# Patient Record
Sex: Female | Born: 1955 | Race: White | Hispanic: No | Marital: Married | Smoking: Never smoker
Health system: Southern US, Community
[De-identification: ages and names within clinical notes are randomized; demographics above are authoritative.]

## PROBLEM LIST (undated history)

## (undated) DIAGNOSIS — N200 Calculus of kidney: Secondary | ICD-10-CM

## (undated) DIAGNOSIS — C439 Malignant melanoma of skin, unspecified: Secondary | ICD-10-CM

## (undated) DIAGNOSIS — K8020 Calculus of gallbladder without cholecystitis without obstruction: Secondary | ICD-10-CM

## (undated) HISTORY — DX: Calculus of kidney: N20.0

## (undated) HISTORY — DX: Calculus of gallbladder without cholecystitis without obstruction: K80.20

## (undated) HISTORY — PX: NASAL TURBINATE REDUCTION: SHX2072

---

## 1998-04-16 ENCOUNTER — Other Ambulatory Visit: Admission: RE | Admit: 1998-04-16 | Discharge: 1998-04-16 | Payer: Self-pay | Admitting: Gynecology

## 1999-05-07 ENCOUNTER — Other Ambulatory Visit: Admission: RE | Admit: 1999-05-07 | Discharge: 1999-05-07 | Payer: Self-pay | Admitting: Gynecology

## 2001-06-28 ENCOUNTER — Other Ambulatory Visit: Admission: RE | Admit: 2001-06-28 | Discharge: 2001-06-28 | Payer: Self-pay | Admitting: Gynecology

## 2002-06-25 ENCOUNTER — Other Ambulatory Visit: Admission: RE | Admit: 2002-06-25 | Discharge: 2002-06-25 | Payer: Self-pay | Admitting: Gynecology

## 2002-09-24 ENCOUNTER — Encounter: Admission: RE | Admit: 2002-09-24 | Discharge: 2002-09-24 | Payer: Self-pay | Admitting: Occupational Medicine

## 2002-09-24 ENCOUNTER — Encounter: Payer: Self-pay | Admitting: Occupational Medicine

## 2002-10-08 ENCOUNTER — Encounter: Admission: RE | Admit: 2002-10-08 | Discharge: 2002-10-28 | Payer: Self-pay | Admitting: Occupational Medicine

## 2002-12-18 ENCOUNTER — Encounter: Admission: RE | Admit: 2002-12-18 | Discharge: 2002-12-18 | Payer: Self-pay | Admitting: Occupational Medicine

## 2002-12-18 ENCOUNTER — Encounter: Payer: Self-pay | Admitting: Occupational Medicine

## 2003-07-01 ENCOUNTER — Other Ambulatory Visit: Admission: RE | Admit: 2003-07-01 | Discharge: 2003-07-01 | Payer: Self-pay | Admitting: Gynecology

## 2003-11-15 ENCOUNTER — Emergency Department (HOSPITAL_COMMUNITY): Admission: EM | Admit: 2003-11-15 | Discharge: 2003-11-15 | Payer: Self-pay | Admitting: *Deleted

## 2004-07-26 ENCOUNTER — Other Ambulatory Visit: Admission: RE | Admit: 2004-07-26 | Discharge: 2004-07-26 | Payer: Self-pay | Admitting: Gynecology

## 2005-10-18 ENCOUNTER — Other Ambulatory Visit: Admission: RE | Admit: 2005-10-18 | Discharge: 2005-10-18 | Payer: Self-pay | Admitting: Gynecology

## 2005-11-03 ENCOUNTER — Encounter: Admission: RE | Admit: 2005-11-03 | Discharge: 2005-11-03 | Payer: Self-pay | Admitting: Internal Medicine

## 2005-11-28 ENCOUNTER — Ambulatory Visit (HOSPITAL_COMMUNITY): Admission: RE | Admit: 2005-11-28 | Discharge: 2005-11-28 | Payer: Self-pay | Admitting: *Deleted

## 2006-10-26 ENCOUNTER — Other Ambulatory Visit: Admission: RE | Admit: 2006-10-26 | Discharge: 2006-10-26 | Payer: Self-pay | Admitting: Gynecology

## 2007-06-11 ENCOUNTER — Ambulatory Visit (HOSPITAL_COMMUNITY): Admission: RE | Admit: 2007-06-11 | Discharge: 2007-06-11 | Payer: Self-pay | Admitting: Internal Medicine

## 2007-10-04 ENCOUNTER — Emergency Department (HOSPITAL_COMMUNITY): Admission: EM | Admit: 2007-10-04 | Discharge: 2007-10-04 | Payer: Self-pay | Admitting: Family Medicine

## 2007-11-19 ENCOUNTER — Other Ambulatory Visit: Admission: RE | Admit: 2007-11-19 | Discharge: 2007-11-19 | Payer: Self-pay | Admitting: Gynecology

## 2008-02-10 ENCOUNTER — Emergency Department (HOSPITAL_COMMUNITY): Admission: EM | Admit: 2008-02-10 | Discharge: 2008-02-10 | Payer: Self-pay | Admitting: Family Medicine

## 2008-06-04 ENCOUNTER — Ambulatory Visit (HOSPITAL_COMMUNITY): Admission: RE | Admit: 2008-06-04 | Discharge: 2008-06-04 | Payer: Self-pay | Admitting: Internal Medicine

## 2010-11-26 NOTE — Op Note (Signed)
NAMEJUANICE, WARBURTON                   ACCOUNT NO.:  [id number]   MEDICAL RECORD NO.:  [id number]          PATIENT TYPE:  AMB   LOCATION:  ENDO                         FACILITY:  [facility]   PHYSICIAN:  Georgiana Spinner, M.D.    DATE OF BIRTH:  1956-01-04   DATE OF PROCEDURE:  11/28/2005  DATE OF DISCHARGE:                                 OPERATIVE REPORT   PROCEDURE:  Colonoscopy.   INDICATIONS FOR PROCEDURE:  Colon cancer screening.   ANESTHESIA:  Demerol 40, Versed 1 mg.   DESCRIPTION OF PROCEDURE:  With the patient mildly sedated in the left  lateral decubitus position, the Olympus videoscopic colonoscope was inserted  in the rectum and passed under direct vision to the cecum identified by the  ileocecal valve and appendiceal orifice both of which were photographed.  From this point, the colonoscope was slowly withdrawn taking circumferential  views of the entire colonic mucosa stopping only in the rectum which  appeared normal on direct and showed hemorrhoids on retroflexed view. The  endoscope was straightened and withdrawn. The patient's vital signs and  pulse oximeter remained stable. The patient tolerated the procedure well  without apparent complications.   FINDINGS:  Internal hemorrhoids otherwise an unremarkable colonoscopic  examination to the cecum.   PLAN:  Consider repeat examination in 5-10 years.           ______________________________  Georgiana Spinner, M.D.     GMO/MEDQ  D:  11/28/2005  T:  11/28/2005  Job:  [id number]

## 2010-11-26 NOTE — Op Note (Signed)
Sharon Flores, Sharon Flores                   ACCOUNT NO.:  [id number]   MEDICAL RECORD NO.:  [id number]          PATIENT TYPE:  AMB   LOCATION:  ENDO                         FACILITY:  [facility]   PHYSICIAN:  Georgiana Spinner, M.D.    DATE OF BIRTH:  [birth year]   DATE OF PROCEDURE:  11/28/2005  DATE OF DISCHARGE:                                 OPERATIVE REPORT   PROCEDURE:  Upper endoscopy.   INDICATIONS:  GERD.   ANESTHESIA:  Demerol 60, Versed 7 mg.   DESCRIPTION OF PROCEDURE:  With the patient mildly sedated in the left  lateral decubitus position, the Olympus videoscopic endoscope was inserted  into the mouth and passed under direct vision through the esophagus which  appeared normal into the stomach, fundus, body, antrum, and duodenal bulb.  The second portion of duodenum appeared normal.  From this point the  endoscope was slowly withdrawn taking circumferential views of the duodenal  mucosa until the endoscope then pulled back into the stomach, placed in  retroflexion to view the stomach from below.  The endoscope was straightened  and withdrawn, taking circumferential views of the remaining gastric and  esophageal mucosa.  The patient's vital signs and pulse oximeter remained  stable.  The patient tolerated the procedure well without apparent  complications.   FINDINGS:  Unremarkable examination.   PLAN:  Proceed to colonoscopy.           ______________________________  Georgiana Spinner, M.D.     GMO/MEDQ  D:  11/28/2005  T:  11/28/2005  Job:  [id number]

## 2011-04-08 LAB — POCT RAPID STREP A: Streptococcus, Group A Screen (Direct): NEGATIVE

## 2012-02-14 ENCOUNTER — Other Ambulatory Visit: Payer: Self-pay | Admitting: Internal Medicine

## 2012-02-14 ENCOUNTER — Ambulatory Visit
Admission: RE | Admit: 2012-02-14 | Discharge: 2012-02-14 | Disposition: A | Discharge status: Home / Self Care | Payer: BC Managed Care – PPO | Source: Ambulatory Visit | Attending: Internal Medicine | Admitting: Internal Medicine

## 2012-02-14 DIAGNOSIS — R413 Other amnesia: Secondary | ICD-10-CM

## 2012-10-01 ENCOUNTER — Other Ambulatory Visit: Payer: Self-pay | Admitting: Internal Medicine

## 2012-10-01 DIAGNOSIS — M67912 Unspecified disorder of synovium and tendon, left shoulder: Secondary | ICD-10-CM

## 2012-10-06 ENCOUNTER — Other Ambulatory Visit: Payer: BC Managed Care – PPO

## 2012-10-11 ENCOUNTER — Ambulatory Visit
Admission: RE | Admit: 2012-10-11 | Discharge: 2012-10-11 | Disposition: A | Discharge status: Home / Self Care | Payer: BC Managed Care – PPO | Source: Ambulatory Visit | Attending: Internal Medicine | Admitting: Internal Medicine

## 2012-10-11 DIAGNOSIS — M67912 Unspecified disorder of synovium and tendon, left shoulder: Secondary | ICD-10-CM

## 2015-07-17 ENCOUNTER — Emergency Department (HOSPITAL_COMMUNITY): Payer: Managed Care, Other (non HMO)

## 2015-07-17 ENCOUNTER — Encounter (HOSPITAL_COMMUNITY): Payer: Self-pay

## 2015-07-17 ENCOUNTER — Emergency Department (HOSPITAL_COMMUNITY)
Admission: EM | Admit: 2015-07-17 | Discharge: 2015-07-17 | Disposition: A | Discharge status: Home / Self Care | Payer: Managed Care, Other (non HMO) | Attending: Emergency Medicine | Admitting: Emergency Medicine

## 2015-07-17 DIAGNOSIS — Z91040 Latex allergy status: Secondary | ICD-10-CM | POA: Insufficient documentation

## 2015-07-17 DIAGNOSIS — R42 Dizziness and giddiness: Secondary | ICD-10-CM

## 2015-07-17 DIAGNOSIS — I10 Essential (primary) hypertension: Secondary | ICD-10-CM | POA: Insufficient documentation

## 2015-07-17 DIAGNOSIS — J0100 Acute maxillary sinusitis, unspecified: Secondary | ICD-10-CM | POA: Diagnosis not present

## 2015-07-17 DIAGNOSIS — Z85820 Personal history of malignant melanoma of skin: Secondary | ICD-10-CM | POA: Insufficient documentation

## 2015-07-17 HISTORY — DX: Malignant melanoma of skin, unspecified: C43.9

## 2015-07-17 LAB — URINALYSIS, ROUTINE W REFLEX MICROSCOPIC
Bilirubin Urine: NEGATIVE
Glucose, UA: NEGATIVE mg/dL
KETONES UR: NEGATIVE mg/dL
LEUKOCYTES UA: NEGATIVE
NITRITE: NEGATIVE
PROTEIN: 30 mg/dL — AB
Specific Gravity, Urine: 1.013 (ref 1.005–1.030)
pH: 7 (ref 5.0–8.0)

## 2015-07-17 LAB — CBC WITH DIFFERENTIAL/PLATELET
Basophils Absolute: 0 10*3/uL (ref 0.0–0.1)
Basophils Relative: 0 %
Eosinophils Absolute: 0.3 10*3/uL (ref 0.0–0.7)
Eosinophils Relative: 4 %
HEMATOCRIT: 37.6 % (ref 36.0–46.0)
HEMOGLOBIN: 13.1 g/dL (ref 12.0–15.0)
LYMPHS ABS: 2.1 10*3/uL (ref 0.7–4.0)
LYMPHS PCT: 27 %
MCH: 30.7 pg (ref 26.0–34.0)
MCHC: 34.8 g/dL (ref 30.0–36.0)
MCV: 88.1 fL (ref 78.0–100.0)
MONO ABS: 0.9 10*3/uL (ref 0.1–1.0)
MONOS PCT: 11 %
NEUTROS ABS: 4.4 10*3/uL (ref 1.7–7.7)
NEUTROS PCT: 58 %
Platelets: 249 10*3/uL (ref 150–400)
RBC: 4.27 MIL/uL (ref 3.87–5.11)
RDW: 12.5 % (ref 11.5–15.5)
WBC: 7.8 10*3/uL (ref 4.0–10.5)

## 2015-07-17 LAB — COMPREHENSIVE METABOLIC PANEL
ALK PHOS: 37 U/L — AB (ref 38–126)
ALT: 20 U/L (ref 14–54)
ANION GAP: 9 (ref 5–15)
AST: 19 U/L (ref 15–41)
Albumin: 4.5 g/dL (ref 3.5–5.0)
BILIRUBIN TOTAL: 0.4 mg/dL (ref 0.3–1.2)
BUN: 11 mg/dL (ref 6–20)
CALCIUM: 9.5 mg/dL (ref 8.9–10.3)
CO2: 30 mmol/L (ref 22–32)
Chloride: 102 mmol/L (ref 101–111)
Creatinine, Ser: 0.71 mg/dL (ref 0.44–1.00)
GFR calc non Af Amer: >60 mL/min (ref >60)
Glucose, Bld: 92 mg/dL (ref 65–99)
Potassium: 3.8 mmol/L (ref 3.5–5.1)
Sodium: 141 mmol/L (ref 135–145)
TOTAL PROTEIN: 7.6 g/dL (ref 6.5–8.1)

## 2015-07-17 LAB — URINE MICROSCOPIC-ADD ON

## 2015-07-17 MED ORDER — HYDROCHLOROTHIAZIDE 25 MG PO TABS: 25.0000 mg | ORAL_TABLET | Freq: Every day | ORAL | Status: DC

## 2015-07-17 MED ORDER — AMOXICILLIN 500 MG PO CAPS: 500.0000 mg | ORAL_CAPSULE | Freq: Three times a day (TID) | ORAL | Status: DC

## 2015-07-17 MED ORDER — DOXYCYCLINE HYCLATE 100 MG PO TABS
100.0000 mg | ORAL_TABLET | Freq: Once | ORAL | Status: AC
  Administered 2015-07-17: 100 mg via ORAL
  Filled 2015-07-17: qty 1

## 2015-07-17 NOTE — Discharge Instructions (Signed)
DASH Eating Plan °DASH stands for "Dietary Approaches to Stop Hypertension." The DASH eating plan is a healthy eating plan that has been shown to reduce high blood pressure (hypertension). Additional health benefits may include reducing the risk of type 2 diabetes mellitus, heart disease, and stroke. The DASH eating plan may also help with weight loss. °WHAT DO I NEED TO KNOW ABOUT THE DASH EATING PLAN? °For the DASH eating plan, you will follow these general guidelines: °· Choose foods with a percent daily value for sodium of less than 5% (as listed on the food label). °· Use salt-free seasonings or herbs instead of table salt or sea salt. °· Check with your health care provider or pharmacist before using salt substitutes. °· Eat lower-sodium products, often labeled as "lower sodium" or "no salt added." °· Eat fresh foods. °· Eat more vegetables, fruits, and low-fat dairy products. °· Choose whole grains. Look for the word "whole" as the first word in the ingredient list. °· Choose fish and skinless chicken or turkey more often than red meat. Limit fish, poultry, and meat to 6 oz (170 g) each day. °· Limit sweets, desserts, sugars, and sugary drinks. °· Choose heart-healthy fats. °· Limit cheese to 1 oz (28 g) per day. °· Eat more home-cooked food and less restaurant, buffet, and fast food. °· Limit fried foods. °· Cook foods using methods other than frying. °· Limit canned vegetables. If you do use them, rinse them well to decrease the sodium. °· When eating at a restaurant, ask that your food be prepared with less salt, or no salt if possible. °WHAT FOODS CAN I EAT? °Seek help from a dietitian for individual calorie needs. °Grains °Whole grain or whole wheat bread. Brown rice. Whole grain or whole wheat pasta. Quinoa, bulgur, and whole grain cereals. Low-sodium cereals. Corn or whole wheat flour tortillas. Whole grain cornbread. Whole grain crackers. Low-sodium crackers. °Vegetables °Fresh or frozen vegetables  (raw, steamed, roasted, or grilled). Low-sodium or reduced-sodium tomato and vegetable juices. Low-sodium or reduced-sodium tomato sauce and paste. Low-sodium or reduced-sodium canned vegetables.  °Fruits °All fresh, canned (in natural juice), or frozen fruits. °Meat and Other Protein Products °Ground beef (85% or leaner), grass-fed beef, or beef trimmed of fat. Skinless chicken or turkey. Ground chicken or turkey. Pork trimmed of fat. All fish and seafood. Eggs. Dried beans, peas, or lentils. Unsalted nuts and seeds. Unsalted canned beans. °Dairy °Low-fat dairy products, such as skim or 1% milk, 2% or reduced-fat cheeses, low-fat ricotta or cottage cheese, or plain low-fat yogurt. Low-sodium or reduced-sodium cheeses. °Fats and Oils °Tub margarines without trans fats. Light or reduced-fat mayonnaise and salad dressings (reduced sodium). Avocado. Safflower, olive, or canola oils. Natural peanut or almond butter. °Other °Unsalted popcorn and pretzels. °The items listed above may not be a complete list of recommended foods or beverages. Contact your dietitian for more options. °WHAT FOODS ARE NOT RECOMMENDED? °Grains °White bread. White pasta. White rice. Refined cornbread. Bagels and croissants. Crackers that contain trans fat. °Vegetables °Creamed or fried vegetables. Vegetables in a cheese sauce. Regular canned vegetables. Regular canned tomato sauce and paste. Regular tomato and vegetable juices. °Fruits °Dried fruits. Canned fruit in light or heavy syrup. Fruit juice. °Meat and Other Protein Products °Fatty cuts of meat. Ribs, chicken wings, bacon, sausage, bologna, salami, chitterlings, fatback, hot dogs, bratwurst, and packaged luncheon meats. Salted nuts and seeds. Canned beans with salt. °Dairy °Whole or 2% milk, cream, half-and-half, and cream cheese. Whole-fat or sweetened yogurt. Full-fat   cheeses or blue cheese. Nondairy creamers and whipped toppings. Processed cheese, cheese spreads, or cheese  curds. Condiments Onion and garlic salt, seasoned salt, table salt, and sea salt. Canned and packaged gravies. Worcestershire sauce. Tartar sauce. Barbecue sauce. Teriyaki sauce. Soy sauce, including reduced sodium. Steak sauce. Fish sauce. Oyster sauce. Cocktail sauce. Horseradish. Ketchup and mustard. Meat flavorings and tenderizers. Bouillon cubes. Hot sauce. Tabasco sauce. Marinades. Taco seasonings. Relishes. Fats and Oils Butter, stick margarine, lard, shortening, ghee, and bacon fat. Coconut, palm kernel, or palm oils. Regular salad dressings. Other Pickles and olives. Salted popcorn and pretzels. The items listed above may not be a complete list of foods and beverages to avoid. Contact your dietitian for more information. WHERE CAN I FIND MORE INFORMATION? National Heart, Lung, and Blood Institute: travelstabloid.com   This information is not intended to replace advice given to you by your health care provider. Make sure you discuss any questions you have with your health care provider.   Document Released: 06/16/2011 Document Revised: 07/18/2014 Document Reviewed: 05/01/2013 Elsevier Interactive Patient Education 2016 Else[employer] Inc[employer] Sinusitis, Adult Sinusitis is redness, soreness, and inflammation of the paranasal sinuses. Paranasal sinuses are air pockets within the bones of your face. They are located beneath your eyes, in the middle of your forehead, and above your eyes. In healthy paranasal sinuses, mucus is able to drain out, and air is able to circulate through them by way of your nose. However, when your paranasal sinuses are inflamed, mucus and air can become trapped. This can allow bacteria and other germs to grow and cause infection. Sinusitis can develop quickly and last only a short time (acute) or continue over a long period (chronic). Sinusitis that lasts for more than 12 weeks is considered chronic. CAUSES Causes of sinusitis  include:  Allergies.  Structural abnormalities, such as displacement of the cartilage that separates your nostrils (deviated septum), which can decrease the air flow through your nose and sinuses and affect sinus drainage.  Functional abnormalities, such as when the small hairs (cilia) that line your sinuses and help remove mucus do not work properly or are not present. SIGNS AND SYMPTOMS Symptoms of acute and chronic sinusitis are the same. The primary symptoms are pain and pressure around the affected sinuses. Other symptoms include:  Upper toothache.  Earache.  Headache.  Bad breath.  Decreased sense of smell and taste.  A cough, which worsens when you are lying flat.  Fatigue.  Fever.  Thick drainage from your nose, which often is green and may contain pus (purulent).  Swelling and warmth over the affected sinuses. DIAGNOSIS Your health care provider will perform a physical exam. During your exam, your health care provider may perform any of the following to help determine if you have acute sinusitis or chronic sinusitis:  Look in your nose for signs of abnormal growths in your nostrils (nasal polyps).  Tap over the affected sinus to check for signs of infection.  View the inside of your sinuses using an imaging device that has a light attached (endoscope). If your health care provider suspects that you have chronic sinusitis, one or more of the following tests may be recommended:  Allergy tests.  Nasal culture. A sample of mucus is taken from your nose, sent to a lab, and screened for bacteria.  Nasal cytology. A sample of mucus is taken from your nose and examined by your health care provider to determine if your sinusitis is related to an allergy. TREATMENT Most cases of  acute sinusitis are related to a viral infection and will resolve on their own within 10 days. Sometimes, medicines are prescribed to help relieve symptoms of both acute and chronic sinusitis.  These may include pain medicines, decongestants, nasal steroid sprays, or saline sprays. However, for sinusitis related to a bacterial infection, your health care provider will prescribe antibiotic medicines. These are medicines that will help kill the bacteria causing the infection. Rarely, sinusitis is caused by a fungal infection. In these cases, your health care provider will prescribe antifungal medicine. For some cases of chronic sinusitis, surgery is needed. Generally, these are cases in which sinusitis recurs more than 3 times per year, despite other treatments. HOME CARE INSTRUCTIONS  Drink plenty of water. Water helps thin the mucus so your sinuses can drain more easily.  Use a humidifier.  Inhale steam 3-4 times a day (for example, sit in the bathroom with the shower running).  Apply a warm, moist washcloth to your face 3-4 times a day, or as directed by your health care provider.  Use saline nasal sprays to help moisten and clean your sinuses.  Take medicines only as directed by your health care provider.  If you were prescribed either an antibiotic or antifungal medicine, finish it all even if you start to feel better. SEEK IMMEDIATE MEDICAL CARE IF:  You have increasing pain or severe headaches.  You have nausea, vomiting, or drowsiness.  You have swelling around your face.  You have vision problems.  You have a stiff neck.  You have difficulty breathing.   This information is not intended to replace advice given to you by your health care provider. Make sure you discuss any questions you have with your health care provider.   Document Released: 06/27/2005 Document Revised: 07/18/2014 Document Reviewed: 07/12/2011 Elsevier Interactive Patient Education 2016 [employer]. Hypertension Hypertension, commonly called high blood pressure, is when the force of blood pumping through your arteries is too strong. Your arteries are the blood vessels that carry blood from  your heart throughout your body. A blood pressure reading consists of a higher number over a lower number, such as 110/72. The higher number (systolic) is the pressure inside your arteries when your heart pumps. The lower number (diastolic) is the pressure inside your arteries when your heart relaxes. Ideally you want your blood pressure below 120/80. Hypertension forces your heart to work harder to pump blood. Your arteries may become narrow or stiff. Having untreated or uncontrolled hypertension can cause heart attack, stroke, kidney disease, and other problems. RISK FACTORS Some risk factors for high blood pressure are controllable. Others are not.  Risk factors you cannot control include:   Race. You may be at higher risk if you are African American.  Age. Risk increases with age.  Gender. Men are at higher risk than women before age 59 years. After age 88, women are at higher risk than men. Risk factors you can control include:  Not getting enough exercise or physical activity.  Being overweight.  Getting too much fat, sugar, calories, or salt in your diet.  Drinking too much alcohol. SIGNS AND SYMPTOMS Hypertension does not usually cause signs or symptoms. Extremely high blood pressure (hypertensive crisis) may cause headache, anxiety, shortness of breath, and nosebleed. DIAGNOSIS To check if you have hypertension, your health care provider will measure your blood pressure while you are seated, with your arm held at the level of your heart. It should be measured at least twice using the same arm.  Certain conditions can cause a difference in blood pressure between your right and left arms. A blood pressure reading that is higher than normal on one occasion does not mean that you need treatment. If it is not clear whether you have high blood pressure, you may be asked to return on a different day to have your blood pressure checked again. Or, you may be asked to monitor your blood pressure  at home for 1 or more weeks. TREATMENT Treating high blood pressure includes making lifestyle changes and possibly taking medicine. Living a healthy lifestyle can help lower high blood pressure. You may need to change some of your habits. Lifestyle changes may include:  Following the DASH diet. This diet is high in fruits, vegetables, and whole grains. It is low in salt, red meat, and added sugars.  Keep your sodium intake below 2,300 mg per day.  Getting at least 30-45 minutes of aerobic exercise at least 4 times per week.  Losing weight if necessary.  Not smoking.  Limiting alcoholic beverages.  Learning ways to reduce stress. Your health care provider may prescribe medicine if lifestyle changes are not enough to get your blood pressure under control, and if one of the following is true:  You are 66-34 years of age and your systolic blood pressure is above 140.  You are 62 years of age or older, and your systolic blood pressure is above 150.  Your diastolic blood pressure is above 90.  You have diabetes, and your systolic blood pressure is over [id number] or your diastolic blood pressure is over 90.  You have kidney disease and your blood pressure is above 140/90.  You have heart disease and your blood pressure is above 140/90. Your personal target blood pressure may vary depending on your medical conditions, your age, and other factors. HOME CARE INSTRUCTIONS  Have your blood pressure rechecked as directed by your health care provider.   Take medicines only as directed by your health care provider. Follow the directions carefully. Blood pressure medicines must be taken as prescribed. The medicine does not work as well when you skip doses. Skipping doses also puts you at risk for problems.  Do not smoke.   Monitor your blood pressure at home as directed by your health care provider. SEEK MEDICAL CARE IF:   You think you are having a reaction to medicines taken.  You have  recurrent headaches or feel dizzy.  You have swelling in your ankles.  You have trouble with your vision. SEEK IMMEDIATE MEDICAL CARE IF:  You develop a severe headache or confusion.  You have unusual weakness, numbness, or feel faint.  You have severe chest or abdominal pain.  You vomit repeatedly.  You have trouble breathing. MAKE SURE YOU:   Understand these instructions.  Will watch your condition.  Will get help right away if you are not doing well or get worse.   This information is not intended to replace advice given to you by your health care provider. Make sure you discuss any questions you have with your health care provider.   Document Released: 06/27/2005 Document Revised: 11/11/2014 Document Reviewed: 04/19/2013 Elsevier Interactive Patient Education [employer].

## 2015-07-17 NOTE — ED Provider Notes (Signed)
CSN: [id number]     Arrival date & time 07/17/15  1216 History   First MD Initiated Contact with Patient 07/17/15 1522     Chief Complaint  Patient presents with  . Hypertension     (Consider location/radiation/quality/duration/timing/severity/associated sxs/prior Treatment) Patient is a 60 y.o. female presenting with hypertension. The history is provided by the patient. No language interpreter was used.  Hypertension This is a new problem. The current episode started today. The problem occurs constantly. The problem has been unchanged. Pertinent negatives include no chest pain, nausea, numbness, vomiting or weakness. Nothing aggravates the symptoms. She has tried nothing for the symptoms. The treatment provided moderate relief.  Pt was sent here for evaluation due to dizziness and hypertension.   Pt reports she saw her NP today and her blood pressure was high.  No fever. No cough  Pt was concerned about having a stroke, no neuro complaints.   Past Medical History  Diagnosis Date  . Melanoma ([place])    History reviewed. No pertinent past surgical history. No family history on file. Social History  Substance Use Topics  . Smoking status: Never Smoker   . Smokeless tobacco: None  . Alcohol Use: Yes     Comment: socially    OB History    No data available     Review of Systems  Cardiovascular: Negative for chest pain.  Gastrointestinal: Negative for nausea and vomiting.  Neurological: Negative for weakness and numbness.  All other systems reviewed and are negative.     Allergies  Codeine and Latex  Home Medications   Prior to Admission medications   Not on File   BP 156/85 mmHg  Pulse 77  Temp(Src) 97.7 F (36.5 C) (Oral)  Resp 18  SpO2 100% Physical Exam  Constitutional: She is oriented to person, place, and time. She appears well-developed and well-nourished.  HENT:  Head: Normocephalic and atraumatic.  Right Ear: External ear normal.  Left Ear: External ear  normal.  Nose: Nose normal.  Mouth/Throat: Oropharynx is clear and moist.  Eyes: Conjunctivae and EOM are normal. Pupils are equal, round, and reactive to light.  Neck: Normal range of motion.  Cardiovascular: Normal rate and normal heart sounds.   Pulmonary/Chest: Effort normal.  Abdominal: Soft. She exhibits no distension.  Musculoskeletal: Normal range of motion.  Neurological: She is alert and oriented to person, place, and time.  Skin: Skin is warm.  Psychiatric: She has a normal mood and affect.  Nursing note and vitals reviewed. Pt also reports sinus congestion and drainage.  ED Course  Procedures (including critical care time) Labs Review Labs Reviewed  COMPREHENSIVE METABOLIC PANEL - Abnormal; Notable for the following:    Alkaline Phosphatase 37 (*)    All other components within normal limits  URINALYSIS, ROUTINE W REFLEX MICROSCOPIC (NOT AT [facility]) - Abnormal; Notable for the following:    APPearance CLOUDY (*)    Hgb urine dipstick LARGE (*)    Protein, ur 30 (*)    All other components within normal limits  URINE MICROSCOPIC-ADD ON - Abnormal; Notable for the following:    Squamous Epithelial / LPF 0-5 (*)    Bacteria, UA MANY (*)    All other components within normal limits  CBC WITH DIFFERENTIAL/PLATELET    Imaging Review Ct Head Wo Contrast  07/17/2015  CLINICAL DATA:  Several month history of dizziness. EXAM: CT HEAD WITHOUT CONTRAST TECHNIQUE: Contiguous axial images were obtained from the base of the skull through the vertex  without intravenous contrast. COMPARISON:  MRI 02/14/2012 FINDINGS: The ventricles are normal in size and configuration. No extra-axial fluid collections are identified. The gray-white differentiation is normal. No CT findings for acute intracranial process such as hemorrhage or infarction. No mass lesions. The brainstem and cerebellum are grossly normal. The bony structures are intact. The paranasal sinuses and mastoid air cells are clear.  The globes are intact. IMPRESSION: Normal head CT. Electronically Signed   By: Marijo Sanes M.D.   On: 07/17/2015 16:07   I have personally reviewed and evaluated these images and lab results as part of my medical decision-making.   EKG Interpretation   Date/Time:  Friday July 17 2015 15:47:53 EST Ventricular Rate:  79 PR Interval:  155 QRS Duration: 85 QT Interval:  404 QTC Calculation: 463 R Axis:   62 Text Interpretation:  Sinus rhythm Confirmed by Hazle Coca [id number]) on  07/17/2015 5:33:05 PM      MDM  I doubt cva,  Symptoms consistent with sinusitis/uri.   I will treat blood pressure with hctz and amoxicillian   Final diagnoses:  Essential hypertension  Dizziness  No current facility-administered medications for this encounter.  Current outpatient prescriptions:  .  COMBIPATCH 0.05-0.14 MG/DAY, Place 1 patch onto the skin 2 (two) times a week. , Disp: , Rfl: 12 .  diphenhydrAMINE (BENADRYL) 25 MG tablet, Take 25 mg by mouth every 6 (six) hours as needed (pain)., Disp: , Rfl:  .  ibuprofen (ADVIL,MOTRIN) 200 MG tablet, Take 200 mg by mouth every 6 (six) hours as needed for moderate pain., Disp: , Rfl:  .  naproxen sodium (ANAPROX) 220 MG tablet, Take 220 mg by mouth 2 (two) times daily as needed (headache and pain)., Disp: , Rfl:  .  amoxicillin (AMOXIL) 500 MG capsule, Take 1 capsule (500 mg total) by mouth 3 (three) times daily., Disp: 30 capsule, Rfl: 0 .  hydrochlorothiazide (HYDRODIURIL) 25 MG tablet, Take 1 tablet (25 mg total) by mouth daily., Disp: 30 tablet, Rfl: 0  An After Visit Summary was printed and given to the patient.    Hollace Kinnier Strawn, PA-C 07/17/15 [place], MD 07/18/15 1245

## 2015-07-17 NOTE — ED Notes (Addendum)
Pt presents with c/o hypertension and dizziness. Pt reports she schedule an appointment with her doctor today because she has been dizzy for several months and has a cold. Pt reports she was sent here by her doctors office because her blood pressure was elevated. Pt does not take any medication for BP, reports she does wear a hormone patch normally but took it off this morning. Pt also reports that she has a fluttering in her right eyelid which has been ongoing, reports she did have a CT of her head years ago which showed some white spots on her brain, reported normal by her doctor.

## 2015-09-17 MED FILL — COMBIPATCH 0.05-0.14 MG PTC: 0.05-0.14 | 28 days supply | Qty: 8 | Fill #0

## 2015-10-13 MED FILL — COMBIPATCH 0.05-0.14 MG PTC: 0.05-0.14 | 28 days supply | Qty: 8 | Fill #1

## 2015-11-25 ENCOUNTER — Ambulatory Visit (INDEPENDENT_AMBULATORY_CARE_PROVIDER_SITE_OTHER): Payer: Managed Care, Other (non HMO) | Admitting: Neurology

## 2015-11-25 ENCOUNTER — Encounter: Payer: Self-pay | Admitting: Neurology

## 2015-11-25 VITALS — BP 142/90 | HR 82 | Resp 20 | Ht 66.0 in | Wt 157.0 lb

## 2015-11-25 DIAGNOSIS — G4701 Insomnia due to medical condition: Secondary | ICD-10-CM

## 2015-11-25 DIAGNOSIS — H93A3 Pulsatile tinnitus, bilateral: Secondary | ICD-10-CM

## 2015-11-25 DIAGNOSIS — H8112 Benign paroxysmal vertigo, left ear: Secondary | ICD-10-CM

## 2015-11-25 DIAGNOSIS — G8929 Other chronic pain: Secondary | ICD-10-CM | POA: Diagnosis not present

## 2015-11-25 DIAGNOSIS — R0683 Snoring: Secondary | ICD-10-CM

## 2015-11-25 DIAGNOSIS — H9312 Tinnitus, left ear: Secondary | ICD-10-CM

## 2015-11-25 DIAGNOSIS — F5102 Adjustment insomnia: Secondary | ICD-10-CM

## 2015-11-25 NOTE — Progress Notes (Signed)
SLEEP MEDICINE CLINIC   Provider:  Larey Seat, M D  Referring Provider: Jani Gravel, MD Primary Care Physician:  Jani Gravel, MD  Chief Complaint  Patient presents with  . Dizziness    4-5 months, is dizzy all the time, has had 3 bad spells when she lays down, room is spinning, rm 11, alone  . New Patient (Initial Visit)    HPI:  Sharon Flores is a 60 y.o. female , seen here as a referral from Dr. Maudie Mercury for Vertigo, light headedness, tinnitus.   Chief complaint according to patient : see above .  MRI by Dr. Estanislado Pandy 2014, and another brain MRI in 02-14-2012 , ordered by Dr Minna Antis were noted.  The patient reports having  undergone an imaging study ,supposedly with [facility], under the guidance of Dr. Andrey Spearman. I do not have any epigastric access to this imaging study. There are no epic notes for this encounter. The patient continues to suffer from occasional numbness and tingling bothering in her ears feeling of dizziness lightheadedness or vertigo. 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 Tamera Punt , she had a tiny kidney stone which was not removed she was also diagnosed with a large gallstone which was also left in place. She has undergone a nasal turbinate reduction and in situ skin melanoma "Mohs"  lesion removal by Dr Lavonna Monarch.   Sleep habits are as follows: " i am a light sleeper". The patient reports that since 2014 when she developed first left shoulder capsulitis and frozen shoulder symptoms and throughout 2016 when her next shoulder was affected as well she had trouble not falling asleep and staying asleep. It was difficult for her to find a comfortable position. She has been told she snores by her husband, who did not think that this was any concerning degree. There has been no reported apnea. The patient usually goes to her  bedroom at 9:30 again she has not trouble going to sleep but staying asleep she has to arise at 5:30 AM. Overall nocturnal sleep time is estimated to be only 6 hours since she was prescribed Xanax. Some of her nights have been interrupted by severe vertigo. Discussed with turning her head position she would wake up and have the feeling of being surrounded by a spinning environment or being in movement but she was not. Xanax helped to suppress this.  She often wakes up before her alarm would ring. She does not feel refreshed or restored in the morning however. She denies any cardiac palpitations, diaphoresis, but she wakes up with headaches. The headaches do not wake her. They can be retro-auricular, occipital or even high parietal located. In the past she has sometimes experience photophobia with her headaches. She develops nausea and queasiness of the stomach with these headaches.  She appears depressed. Used to work as a Development worker, community as a Marine scientist.  Sleep medical history and family sleep history: sleep talker , not walker. Turbinate reduction. No tonsillectomy.  Social history:  Married, second marriage, daughter 43 in June and son is 5, and a grandson by her daughter , turning 53- living with grandparents . Daughter 'Vicente Males'  is a substance abuser.   Review of Systems: Out of a complete 14 system review, the patient complains of only the following symptoms, and all other reviewed systems are negative.  Epworth score  ,  Fatigue severity score and  depression score  Were deferred.     Social History   Social History  . Marital Status: Married    Spouse Name: N/A  . Number of Children: N/A  . Years of Education: N/A   Occupational History  . Not on file.   Social History Main Topics  . Smoking status: Never Smoker   . Smokeless tobacco: Not on file  . Alcohol Use: 1.8 oz/week    3 Standard drinks or equivalent per week     Comment: socially   . Drug Use: No  . Sexual Activity: Not on file    Other Topics Concern  . Not on file   Social History Narrative   Drinks 2 cups of caffeine daily.    Family History  Problem Relation Age of Onset  . Lung cancer Mother   . Diabetes Mellitus II Father   . Heart failure Father   . Hypertension Maternal Grandfather     Past Medical History  Diagnosis Date  . Melanoma ([place])   . Gallstone   . Kidney stone     Past Surgical History  Procedure Laterality Date  . Nasal turbinate reduction      Current Outpatient Prescriptions  Medication Sig Dispense Refill  . acetaminophen (TYLENOL) 325 MG tablet Take 650 mg by mouth every 6 (six) hours as needed.    . ALPRAZolam (XANAX) 0.25 MG tablet Take 0.25 mg by mouth at bedtime.    Marland Kitchen ibuprofen (ADVIL,MOTRIN) 200 MG tablet Take 200 mg by mouth every 6 (six) hours as needed for moderate pain.     No current facility-administered medications for this visit.    Allergies as of 11/25/2015 - Review Complete 11/25/2015  Allergen Reaction Noted  . Codeine  07/17/2015  . Latex  07/17/2015    Vitals: BP 142/90 mmHg  Pulse 82  Resp 20  Ht 5\' 6"  (1.676 m)  Wt 157 lb (71.215 kg)  BMI 25.35 kg/m2 Last Weight:  Wt Readings from Last 1 Encounters:  11/25/15 157 lb (71.215 kg)   [id number] mass index is 25.35 kg/(m^2).     Last Height:   Ht Readings from Last 1 Encounters:  11/25/15 5\' 6"  (1.676 m)    Physical exam:  General: The patient is awake, alert and appears not in acute distress. The patient is well groomed. Head: Normocephalic, atraumatic. Neck is supple. Mallampati 3,  neck circumference:14.5 . Nasal airflow Patent , TMJ is  Not  evident . Retrognathia is not seen.  Cardiovascular:  Regular rate and rhythm, without  murmurs or carotid bruit, and without distended neck veins. Respiratory: Lungs are clear to auscultation. Skin:  Without evidence of edema, or rash Trunk: BMI is 25  Neurologic exam : The patient is awake and alert, oriented to place and time.   Memory  subjective  described as intact.  Attention span & concentration ability appears normal.  Speech is fluent,  without  dysarthria, dysphonia or aphasia.  Mood and affect are appropriate.  Cranial nerves: Pupils are equal and briskly reactive to light. Funduscopic exam without evidence of pallor or edema. Extraocular movements  in vertical and horizontal planes intact and without nystagmus. Visual fields by finger perimetry are intact. Passive head movement by the patient's eyes were closed brought on a sense of the room spinning in a clockwise direction. Nystagmus was noted with gaze to the left but not in right  or vertical direction. The patient also reported a pressure sensation  slight pain at the left ear. Hearing to finger rub intact.   Facial sensation intact to fine touch.  Facial motor strength is symmetric and tongue and uvula move midline. Shoulder shrug was symmetrical.   Motor exam:  Normal tone, muscle bulk and symmetric strength in all extremities. There is still a range of motion restriction with the right shoulder especially the scapular elevation and the acromioclavicular joint seem to pop.  Sensory:  Fine touch, pinprick and vibration were tested in all extremities.  Proprioception tested in the upper extremities was normal.  Coordination: Rapid alternating movements in the fingers/hands was normal. Finger-to-nose maneuver  normal without evidence of ataxia, dysmetria or tremor.  Gait and station: Patient walks without assistive device and is able unassisted to climb up to the exam table. Strength within normal limits.  Stance is stable and normal.  Mrs. Harshman turned with 3 steps and did not experience dizziness. His spontaneous gait is slightly wider based than normal. She does have bilaterally normal arm swing there is no tremor noted no limp and no drift  Deep tendon reflexes: in the  upper and lower extremities are symmetric and intact. Babinski maneuver response is  downgoing.  The patient was advised of the nature of the diagnosed sleep disorder , the treatment options and risks for general a health and wellness arising from not treating the condition.  I spent more than 45 minutes of face to face time with the patient. Greater than 50% of time was spent in counseling and coordination of care. We have discussed the diagnosis and differential and I answered the patient's questions.     Assessment:  After physical and neurologic examination, review of laboratory studies,  Personal review of imaging studies, reports of other /same  Imaging studies ,  Results of polysomnography/ neurophysiology testing and pre-existing records as far as provided in visit., my assessment is   1) Mrs. Kirker suffers from vertigo, confirmed by rapid head movement test. Nystagmus with gaze to the left. Clockwise spinning sensation. I will refer to vestibular rehabilitation. She already received Xanax which will suppress vertigo. I would like her not to take the Xanax prior to an examination at the vestibular rehabilitation center to allow a complete picture.  2) Mrs. Fever also reports nonrestorative sleep and excessive daytime sleepiness, fatigue. This could be due to an organic sleep disorder also she reports falling asleep her sleep is fragmented partially due to pain, partially for unknown reasons. She does not report vivid dreams at this time as she had previously. There is no evidence of narcolepsy, sleep paralysis or cataplexy. Her husband has stated that she sometimes snores but he has not noted apneas.  3) Mrs. Asturias is de facto  raising her 29 year old grandson. He is described as possible having ADD not ADHD. I would like for her to undergo a depression screening. Fatigue is likely a manifestation.     Plan:  Treatment plan and additional workup :  PSG,  Vestibular rehab. Xanax can be continued.    Asencion Partridge Justo Hengel MD  11/25/2015   CC: I reviewed the MRI from 02-14-2012  with the patient, there are a few white matter lesions that I would consider unspecific. This seems to have been another MRI obtained in 2014 and none since. If vestibular rehabilitation does not lead to a resolution of her dizziness I would strongly recommend to obtain another MRI study to see if there is a trend and if that may have been more white matter  lesions accumulated. In the meantime I also recommended Dr. Oneida Alar in sports medicine for evaluation of her shoulder should she need a second opinion.   Jani Gravel, Md [address] [place], [place] [address]

## 2015-11-25 NOTE — Patient Instructions (Signed)
Vertigo Vertigo means that you feel like you are moving when you are not. Vertigo can also make you feel like things around you are moving when they are not. This feeling can come and go at any time. Vertigo often goes away on its own. HOME CARE  Avoid making fast movements.  Avoid driving.  Avoid using heavy machinery.  Avoid doing any task or activity that might cause danger to you or other people if you would have a vertigo attack while you are doing it.  Sit down right away if you feel dizzy or have trouble with your balance.  Take over-the-counter and prescription medicines only as told by your doctor.  Follow instructions from your doctor about which positions or movements you should avoid.  Drink enough fluid to keep your pee (urine) clear or pale yellow.  Keep all follow-up visits as told by your doctor. This is important. GET HELP IF:  Medicine does not help your vertigo.  You have a fever.  Your problems get worse or you have new symptoms.  Your family or friends see changes in your behavior.  You feel sick to your stomach (nauseous) or you throw up (vomit).  You have a "pins and needles" feeling or you are numb in part of your body. GET HELP RIGHT AWAY IF:  You have trouble moving or talking.  You are always dizzy.  You pass out (faint).  You get very bad headaches.  You feel weak or have trouble using your hands, arms, or legs.  You have changes in your hearing.  You have changes in your seeing (vision).  You get a stiff neck.  Bright light starts to bother you.   This information is not intended to replace advice given to you by your health care provider. Make sure you discuss any questions you have with your health care provider.   Document Released: 04/05/2008 Document Revised: 03/18/2015 Document Reviewed: 10/20/2014 Elsevier Interactive Patient Education 2016 Elsevier Inc.  

## 2015-12-15 ENCOUNTER — Ambulatory Visit: Payer: Managed Care, Other (non HMO) | Attending: Neurology | Admitting: Physical Therapy

## 2015-12-15 ENCOUNTER — Encounter: Payer: Self-pay | Admitting: Physical Therapy

## 2015-12-15 VITALS — BP 167/113

## 2015-12-15 DIAGNOSIS — R42 Dizziness and giddiness: Secondary | ICD-10-CM | POA: Insufficient documentation

## 2015-12-16 NOTE — Therapy (Signed)
[facility] [address] [place], [place], [address] Phone: [phone]   Fax:  [phone]  Physical Therapy Evaluation  Patient Details  Name: Sharon Flores MRN: [id number] Date of Birth: 1956-02-22 Referring Provider: Dr. Asencion Partridge Dohmeier  Encounter Date: 12/15/2015      PT End of Session - 12/16/15 1115    Visit Number 1   Number of Visits 1   Authorization Type Aet[employer]   PT Start Time 0800   PT Stop Time 0846   PT Time Calculation (min) 46 min      Past Medical History  Diagnosis Date  . Melanoma ([place])   . Gallstone   . Kidney stone     Past Surgical History  Procedure Laterality Date  . 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 Asencion Partridge Dohmeier   Hand Dominance --  Sept. 2016   Balance Screen   Has the patient fallen in the past 6 months No   Has the patient had a decrease in activity level because of a fear of falling?  No   Is the patient reluctant to leave their home because of a fear of falling?  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       R and L Dix-Hallpike tests (-) for any nystagmus and no c/o increased vertigo with either of these tests nor with sidelying tests R nor L  Sensory Organization test  score 83/100 for composite with N= 70/100 All inputs of somatosensory, visual and vestibular are WNL's - no FALL occurred on any of 18 trials of this test  DVA WNL's with a 2 line difference from static visual acuity; Line 10 static with line 8 dynamic                      PT Education - 12/16/15 1113    Education provided Yes   Education Details recommend to pt that she follow up with MD regarding hgh BP today and also for further diagnostic workup to determine etiology of vertigo as it does not appear to be of vestibular dysfunction   Person(s) Educated Patient   Methods Explanation   Comprehension Verbalized understanding                    Plan - 12/16/15 1115    Clinical Impression Statement Pt is a 60 year old lady wtih c/o constant vertigo which started approx. 9 months ago; pt reports constant buzzing/static noise in ears and she states she has had nausea but no vomiting; pt reports feeling vertigo at time of eval but says it is  very mild; states she has only had 1 "bad episode " of vertigo since ED visit in Jan. (HTN was reason for this visit); SOT socre is WNL and DVA is WNL but BP is very high today - symptoms of vertigo/dizziness do not appear to be of vestibular system dysfunction at this time - no positonal tesitng provoked any nystagmus nor incr. c/o vertigo                                                                                                                                  Rehab Potential --  N/A - eval only   PT Frequency 1x / week  eval only   PT Treatment/Interventions Patient/family education   PT Next Visit Plan N/A   Recommended Other Services recommend pt follow up with MD re: HTN and for further diagnostic testing as signs/symptoms not consisten with BPPV at today's eval   Consulted and Agree with Plan of Care Patient      Patient will benefit from skilled therapeutic intervention in order to improve the following deficits and  impairments:     Visit Diagnosis: Dizziness and giddiness - Plan: PT plan of care cert/re-cert     Problem List There are no active problems to display for this patient.   Alda Lea, PT 12/16/2015, 11:30 AM  [facility] [address] [place], [place], [address] Phone: [phone]   Fax:  [phone]  Name: Sharon Flores MRN: [id number] Date of Birth: 12-Apr-1956

## 2016-01-15 MED FILL — COMBIPATCH 0.05-0.14 MG PTC: 0.05-0.14 | 28 days supply | Qty: 8 | Fill #2

## 2016-02-22 MED FILL — COMBIPATCH 0.05-0.14 MG PTC: 0.05-0.14 | 28 days supply | Qty: 8 | Fill #0

## 2016-03-29 MED FILL — COMBIPATCH 0.05-0.14 MG PTC: 0.05-0.14 | 28 days supply | Qty: 8 | Fill #1

## 2016-05-06 MED FILL — COMBIPATCH 0.05-0.14 MG PTC: 0.05-0.14 | 28 days supply | Qty: 8 | Fill #0

## 2016-07-05 MED FILL — COMBIPATCH 0.05-0.14 MG PTC: 0.05-0.14 | 28 days supply | Qty: 8 | Fill #1

## 2016-08-08 DIAGNOSIS — N950 Postmenopausal bleeding: Secondary | ICD-10-CM | POA: Diagnosis not present

## 2016-08-08 DIAGNOSIS — D259 Leiomyoma of uterus, unspecified: Secondary | ICD-10-CM | POA: Diagnosis not present

## 2016-08-08 MED FILL — ESTRADIOL 0.1 MG/GM CRM: 0.1 | 90 days supply | Qty: 43 | Fill #0

## 2016-11-02 MED FILL — COMBIPATCH 0.05-0.14 MG PTC: 0.05-0.14 | 28 days supply | Qty: 8 | Fill #2

## 2016-11-09 DIAGNOSIS — Z0000 Encounter for general adult medical examination without abnormal findings: Secondary | ICD-10-CM | POA: Diagnosis not present

## 2016-11-17 DIAGNOSIS — R05 Cough: Secondary | ICD-10-CM | POA: Diagnosis not present

## 2016-11-17 DIAGNOSIS — Z0000 Encounter for general adult medical examination without abnormal findings: Secondary | ICD-10-CM | POA: Diagnosis not present

## 2016-12-06 MED FILL — COMBIPATCH 0.05-0.14 MG PTC: 0.05-0.14 | 28 days supply | Qty: 8 | Fill #3

## 2017-01-18 MED FILL — COMBIPATCH 0.05-0.14 MG PTC: 0.05-0.14 | 28 days supply | Qty: 8 | Fill #4

## 2017-03-10 MED FILL — COMBIPATCH 0.05-0.14 MG PTC: 0.05-0.14 | 28 days supply | Qty: 8 | Fill #5

## 2017-03-28 DIAGNOSIS — H52202 Unspecified astigmatism, left eye: Secondary | ICD-10-CM | POA: Diagnosis not present

## 2017-03-28 DIAGNOSIS — H524 Presbyopia: Secondary | ICD-10-CM | POA: Diagnosis not present

## 2017-03-28 DIAGNOSIS — H5202 Hypermetropia, left eye: Secondary | ICD-10-CM | POA: Diagnosis not present

## 2017-04-05 ENCOUNTER — Encounter (HOSPITAL_COMMUNITY): Payer: Self-pay | Admitting: Emergency Medicine

## 2017-04-05 ENCOUNTER — Ambulatory Visit (HOSPITAL_COMMUNITY)
Admission: EM | Admit: 2017-04-05 | Discharge: 2017-04-05 | Disposition: A | Discharge status: Home / Self Care | Payer: BLUE CROSS/BLUE SHIELD | Attending: Family Medicine | Admitting: Family Medicine

## 2017-04-05 ENCOUNTER — Ambulatory Visit (INDEPENDENT_AMBULATORY_CARE_PROVIDER_SITE_OTHER): Payer: BLUE CROSS/BLUE SHIELD

## 2017-04-05 DIAGNOSIS — R1084 Generalized abdominal pain: Secondary | ICD-10-CM

## 2017-04-05 DIAGNOSIS — R109 Unspecified abdominal pain: Secondary | ICD-10-CM | POA: Diagnosis not present

## 2017-04-05 NOTE — ED Triage Notes (Signed)
PT reports abdominal pain that started a week ago. PT reports intermittent N/V/D. PT reports headache and sore throat as well.

## 2017-04-05 NOTE — Discharge Instructions (Signed)
Please return here or to the Emergency Department immediately should you feel worse in any way or have any of the following symptoms: increasing or different abdominal pain, persistent vomiting, fevers, or shaking chills. ° ° °

## 2017-04-06 NOTE — ED Provider Notes (Signed)
[facility]   [id number] 04/05/17 Arrival Time: 5400  ASSESSMENT & PLAN:  1. Generalized abdominal pain    Reassured that this does not appear to be anything serious at this time. Discussed x-ray results. Close observation. Will f/u 24-48 hours for recheck if worsening, ED if needed. Reviewed expectations re: course of current medical issues. Questions answered. Outlined signs and symptoms indicating need for more acute intervention. Patient verbalized understanding. After Visit Summary given.   SUBJECTIVE:  Sharon Flores is a 61 y.o. female who presents with complaint of abdominal discomfort. Onset gradual, 1 week ago. Described as pressure-like. Location: diffusely without radiation. "Kind of moves around." Described symptoms are unchanged since beginning. Aggravating factors: none. Alleviating factors: none. Associated symptoms: belching. The patient denies anorexia, chills, diarrhea, fever and vomiting. Appetite: normal. PO intake: normal. Ambulatory without assistance. Urinary symptoms: none. OTC treatment: None.  No LMP recorded. Patient is postmenopausal.  History of similar symptoms: No.  Past Surgical History:  Procedure Laterality Date  . NASAL TURBINATE REDUCTION      ROS: As per HPI. All other systems negative.  OBJECTIVE:  Vitals:   04/05/17 1645 04/05/17 1646  BP:  (!) 145/84  Pulse:  72  Resp:  16  Temp:  98.7 F (37.1 C)  TempSrc:  Oral  SpO2:  99%  Weight: 142 lb (64.4 kg)   Height: 5\' 6"  (1.676 m)     General appearance: alert; no distress Lungs: clear to auscultation bilaterally Heart: regular rate and rhythm Abdomen: soft; non-distended; very mild tenderness over upper abdomen; bowel sounds present; no masses or organomegaly; no guarding or rebound tenderness Back: no CVA tenderness Extremities: no cyanosis or edema; symmetrical with no gross deformities Skin: warm and dry Neurologic: normal gait Psychological: alert and  cooperative  Imaging: Dg Abd Acute W/chest  Result Date: 04/05/2017 CLINICAL DATA:  Per pt: abdominal pain, gas and bloating for over a week, no appetite, vomiting and diarrhea, no diarrhea today, no fever. No history of cardiac or respiratory disease. Non-smoker. No surgery to the abdomen or chest. Patient is not a diabetic EXAM: DG ABDOMEN ACUTE W/ 1V CHEST COMPARISON:  CT, 09/08/2015 FINDINGS: There is no bowel dilation to suggest obstruction. There are scattered air-fluid levels on the erect view. No free air. No significant increased stool noted in the colon. No evidence of renal or ureteral stones. The heart, mediastinum and hila are unremarkable. The lungs are clear. No acute skeletal abnormality. IMPRESSION: 1. No evidence of bowel obstruction or free air. 2. Air-fluid levels on the erect view. This may be due to gastroenteritis or a mild adynamic ileus. 3. No active cardiopulmonary disease. Electronically Signed   By: Lajean Manes M.D.   On: 04/05/2017 17:25    Allergies  Allergen Reactions  . Codeine     Intolerance   . Latex     Itching                                               Past Medical History:  Diagnosis Date  . Gallstone   . Kidney stone   . Melanoma [facility])    Social History   Social History  . Marital status: Married    Spouse name: N/A  . Number of children: N/A  . Years of education: N/A   Occupational History  . Not on file.  Social History Main Topics  . Smoking status: Never Smoker  . Smokeless tobacco: Never Used  . Alcohol use 1.8 oz/week    3 Standard drinks or equivalent per week     Comment: socially   . Drug use: No  . Sexual activity: Not on file   Other Topics Concern  . Not on file   Social History Narrative   Drinks 2 cups of caffeine daily.   Family History  Problem Relation Age of Onset  . Lung cancer Mother   . Diabetes Mellitus II Father   . Heart failure Father   . Hypertension Maternal Reymundo Poll,  MD 04/06/17 [phone]

## 2017-04-20 MED FILL — COMBIPATCH 0.05-0.14 MG PTC: 0.05-0.14 | 28 days supply | Qty: 8 | Fill #6

## 2017-05-26 MED FILL — COMBIPATCH 0.05-0.14 MG PTC: 0.05-0.14 | 28 days supply | Qty: 8 | Fill #0

## 2017-06-09 DIAGNOSIS — Z01419 Encounter for gynecological examination (general) (routine) without abnormal findings: Secondary | ICD-10-CM | POA: Diagnosis not present

## 2017-06-09 DIAGNOSIS — Z13820 Encounter for screening for osteoporosis: Secondary | ICD-10-CM | POA: Diagnosis not present

## 2017-06-09 DIAGNOSIS — Z6824 Body mass index (BMI) 24.0-24.9, adult: Secondary | ICD-10-CM | POA: Diagnosis not present

## 2017-06-09 DIAGNOSIS — Z1231 Encounter for screening mammogram for malignant neoplasm of breast: Secondary | ICD-10-CM | POA: Diagnosis not present

## 2017-07-10 MED FILL — COMBIPATCH 0.05-0.14 MG PTC: 0.05-0.14 | 28 days supply | Qty: 8 | Fill #0

## 2017-08-21 MED FILL — COMBIPATCH 0.05-0.14 MG PTC: 0.05-0.14 | 28 days supply | Qty: 8 | Fill #1

## 2017-10-13 MED FILL — COMBIPATCH 0.05-0.14 MG PTC: 0.05-0.14 | 28 days supply | Qty: 8 | Fill #2

## 2017-11-14 DIAGNOSIS — N390 Urinary tract infection, site not specified: Secondary | ICD-10-CM | POA: Diagnosis not present

## 2017-11-14 DIAGNOSIS — Z0000 Encounter for general adult medical examination without abnormal findings: Secondary | ICD-10-CM | POA: Diagnosis not present

## 2017-11-14 DIAGNOSIS — R319 Hematuria, unspecified: Secondary | ICD-10-CM | POA: Diagnosis not present

## 2017-11-21 DIAGNOSIS — Z0000 Encounter for general adult medical examination without abnormal findings: Secondary | ICD-10-CM | POA: Diagnosis not present

## 2017-11-21 DIAGNOSIS — R109 Unspecified abdominal pain: Secondary | ICD-10-CM | POA: Diagnosis not present

## 2017-11-22 ENCOUNTER — Other Ambulatory Visit: Payer: Self-pay | Admitting: Internal Medicine

## 2017-11-22 DIAGNOSIS — R109 Unspecified abdominal pain: Secondary | ICD-10-CM

## 2017-11-23 ENCOUNTER — Other Ambulatory Visit: Payer: Self-pay | Admitting: Internal Medicine

## 2017-11-23 DIAGNOSIS — Z1231 Encounter for screening mammogram for malignant neoplasm of breast: Secondary | ICD-10-CM

## 2017-11-24 MED FILL — COMBIPATCH 0.05-0.14 MG PTC: 0.05-0.14 | 28 days supply | Qty: 8 | Fill #3

## 2017-11-29 ENCOUNTER — Ambulatory Visit
Admission: RE | Admit: 2017-11-29 | Discharge: 2017-11-29 | Disposition: A | Discharge status: Home / Self Care | Payer: BLUE CROSS/BLUE SHIELD | Source: Ambulatory Visit | Attending: Internal Medicine | Admitting: Internal Medicine

## 2017-11-29 DIAGNOSIS — K8020 Calculus of gallbladder without cholecystitis without obstruction: Secondary | ICD-10-CM | POA: Diagnosis not present

## 2017-11-29 DIAGNOSIS — R109 Unspecified abdominal pain: Secondary | ICD-10-CM

## 2017-12-21 DIAGNOSIS — K8020 Calculus of gallbladder without cholecystitis without obstruction: Secondary | ICD-10-CM | POA: Diagnosis not present

## 2017-12-21 DIAGNOSIS — R110 Nausea: Secondary | ICD-10-CM | POA: Diagnosis not present

## 2017-12-21 DIAGNOSIS — R1013 Epigastric pain: Secondary | ICD-10-CM | POA: Diagnosis not present

## 2018-01-09 DIAGNOSIS — K8010 Calculus of gallbladder with chronic cholecystitis without obstruction: Secondary | ICD-10-CM | POA: Diagnosis not present

## 2018-01-22 MED FILL — COMBIPATCH 0.05-0.14 MG PTC: 0.05-0.14 | 28 days supply | Qty: 8 | Fill #4

## 2018-03-14 MED FILL — COMBIPATCH 0.05-0.14 MG PTC: 0.05-0.14 | 28 days supply | Qty: 8 | Fill #5

## 2018-04-16 DIAGNOSIS — K8010 Calculus of gallbladder with chronic cholecystitis without obstruction: Secondary | ICD-10-CM | POA: Diagnosis not present

## 2018-04-16 DIAGNOSIS — K812 Acute cholecystitis with chronic cholecystitis: Secondary | ICD-10-CM | POA: Diagnosis not present

## 2018-04-27 MED FILL — COMBIPATCH 0.05-0.14 MG PTC: 0.05-0.14 | 28 days supply | Qty: 8 | Fill #6

## 2018-06-12 MED FILL — COMBIPATCH 0.05-0.25 MG PTC: 0.05-0.25 | 30 days supply | Qty: 8 | Fill #0

## 2018-06-13 DIAGNOSIS — Z6826 Body mass index (BMI) 26.0-26.9, adult: Secondary | ICD-10-CM | POA: Diagnosis not present

## 2018-06-13 DIAGNOSIS — Z1231 Encounter for screening mammogram for malignant neoplasm of breast: Secondary | ICD-10-CM | POA: Diagnosis not present

## 2018-06-13 DIAGNOSIS — Z01419 Encounter for gynecological examination (general) (routine) without abnormal findings: Secondary | ICD-10-CM | POA: Diagnosis not present

## 2018-06-20 IMAGING — DX DG ABDOMEN ACUTE W/ 1V CHEST
3 series · 3 of 3 positions shown · non-contrast
Comparison: CT, 09/08/2015

CLINICAL DATA: Per pt: abdominal pain, gas and bloating for over a
week, no appetite, vomiting and diarrhea, no diarrhea today, no
fever. No history of cardiac or respiratory disease. Non-smoker. No
surgery to the abdomen or chest. Patient is not a diabetic

EXAM:
DG ABDOMEN ACUTE W/ 1V CHEST

[chest pa]
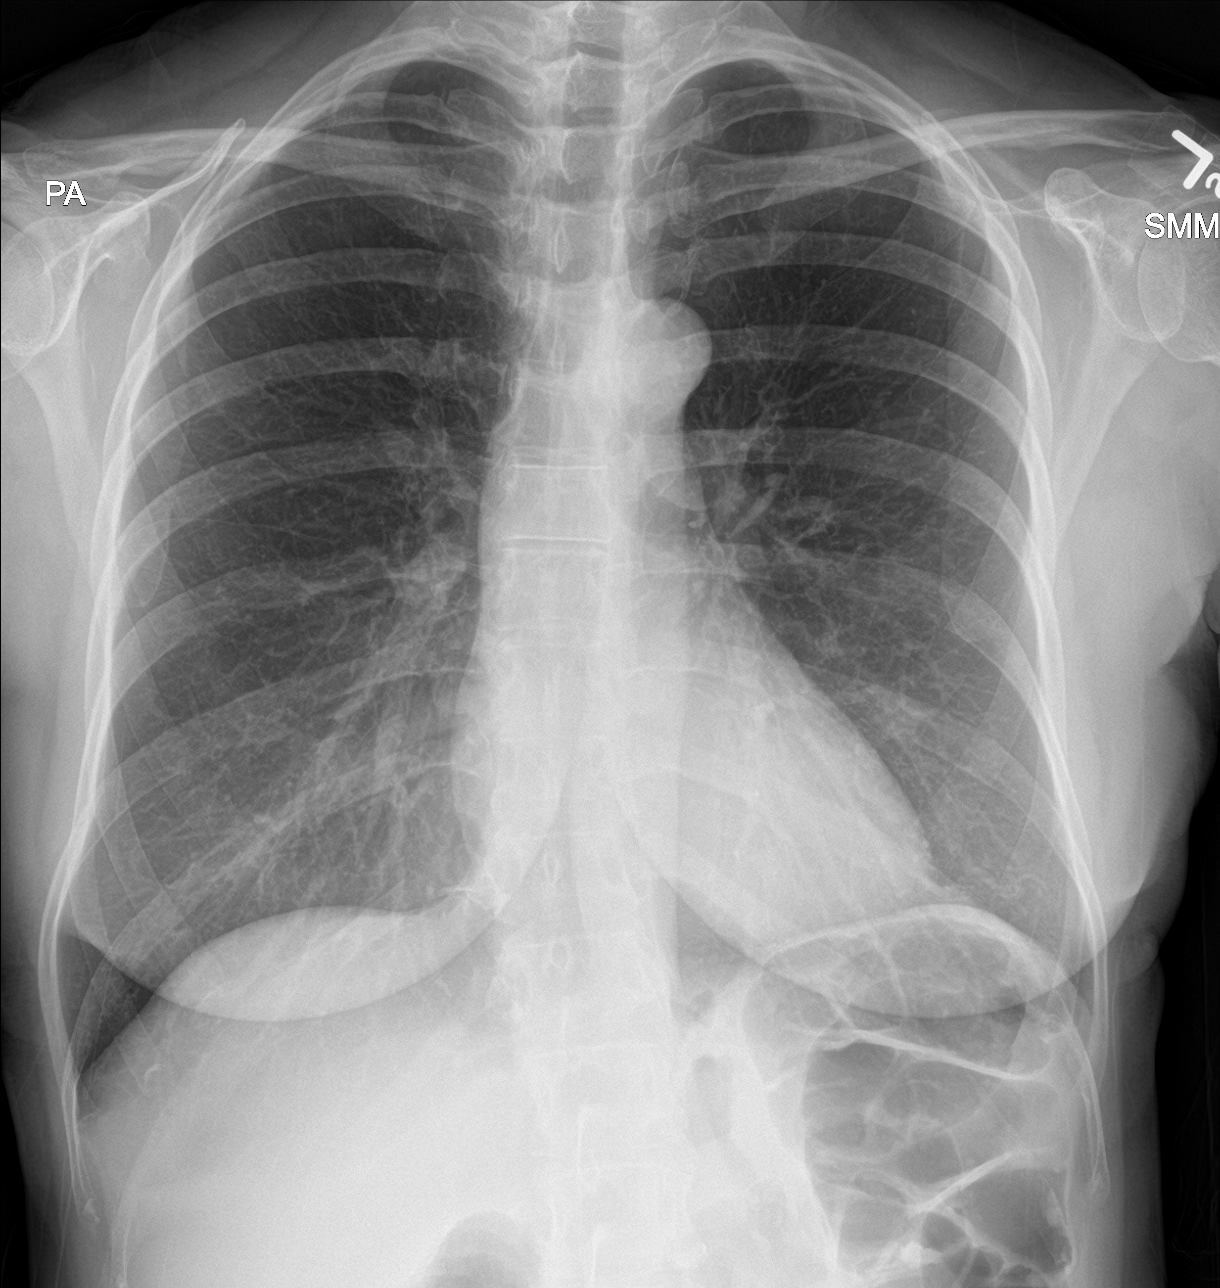

[abdomen erect]
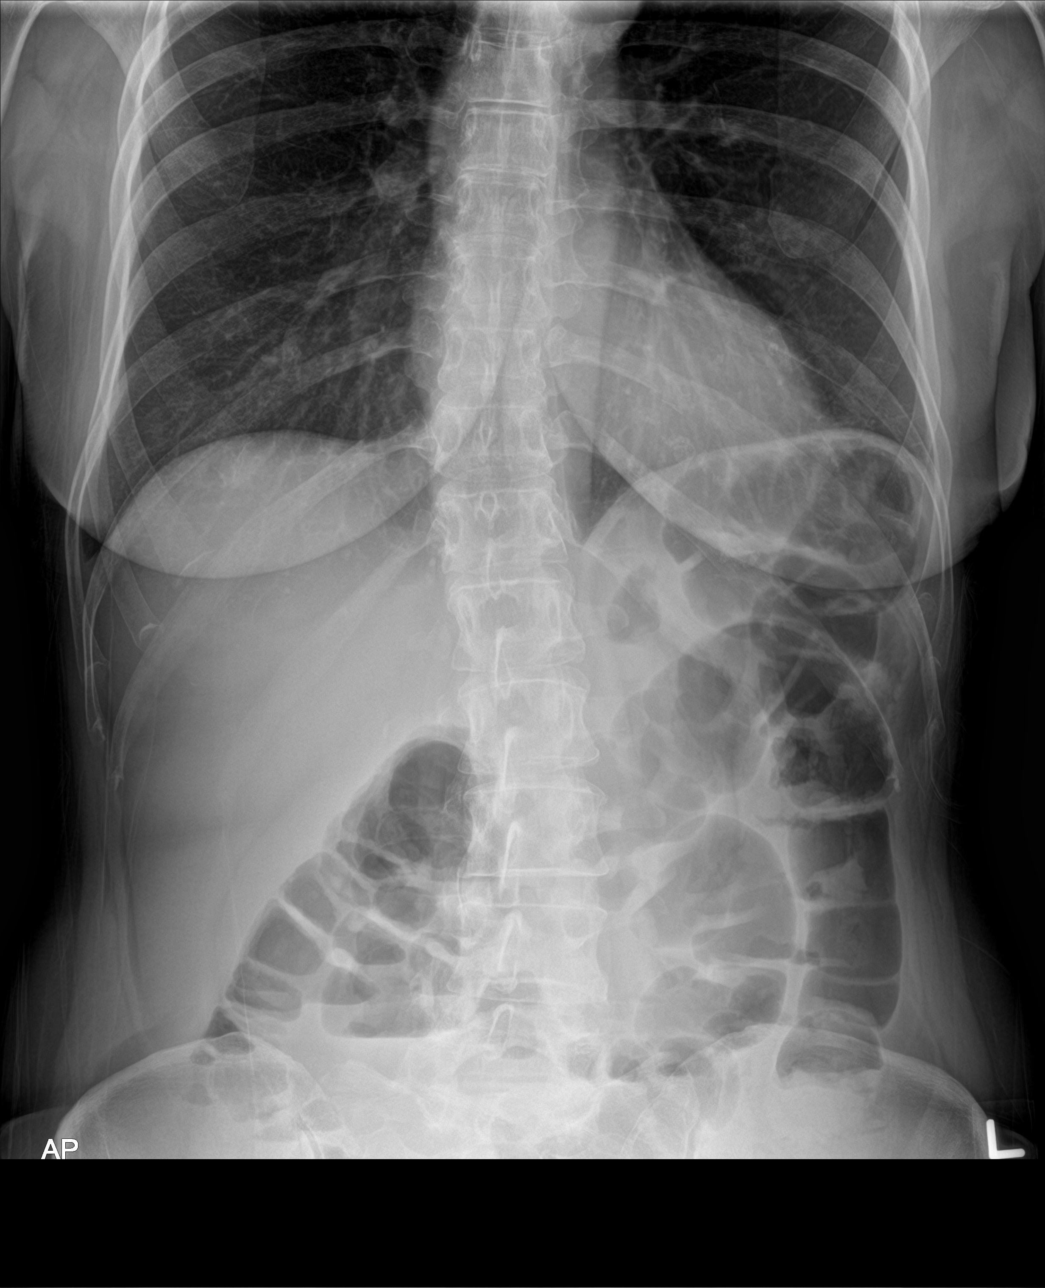

[abdomen supine]
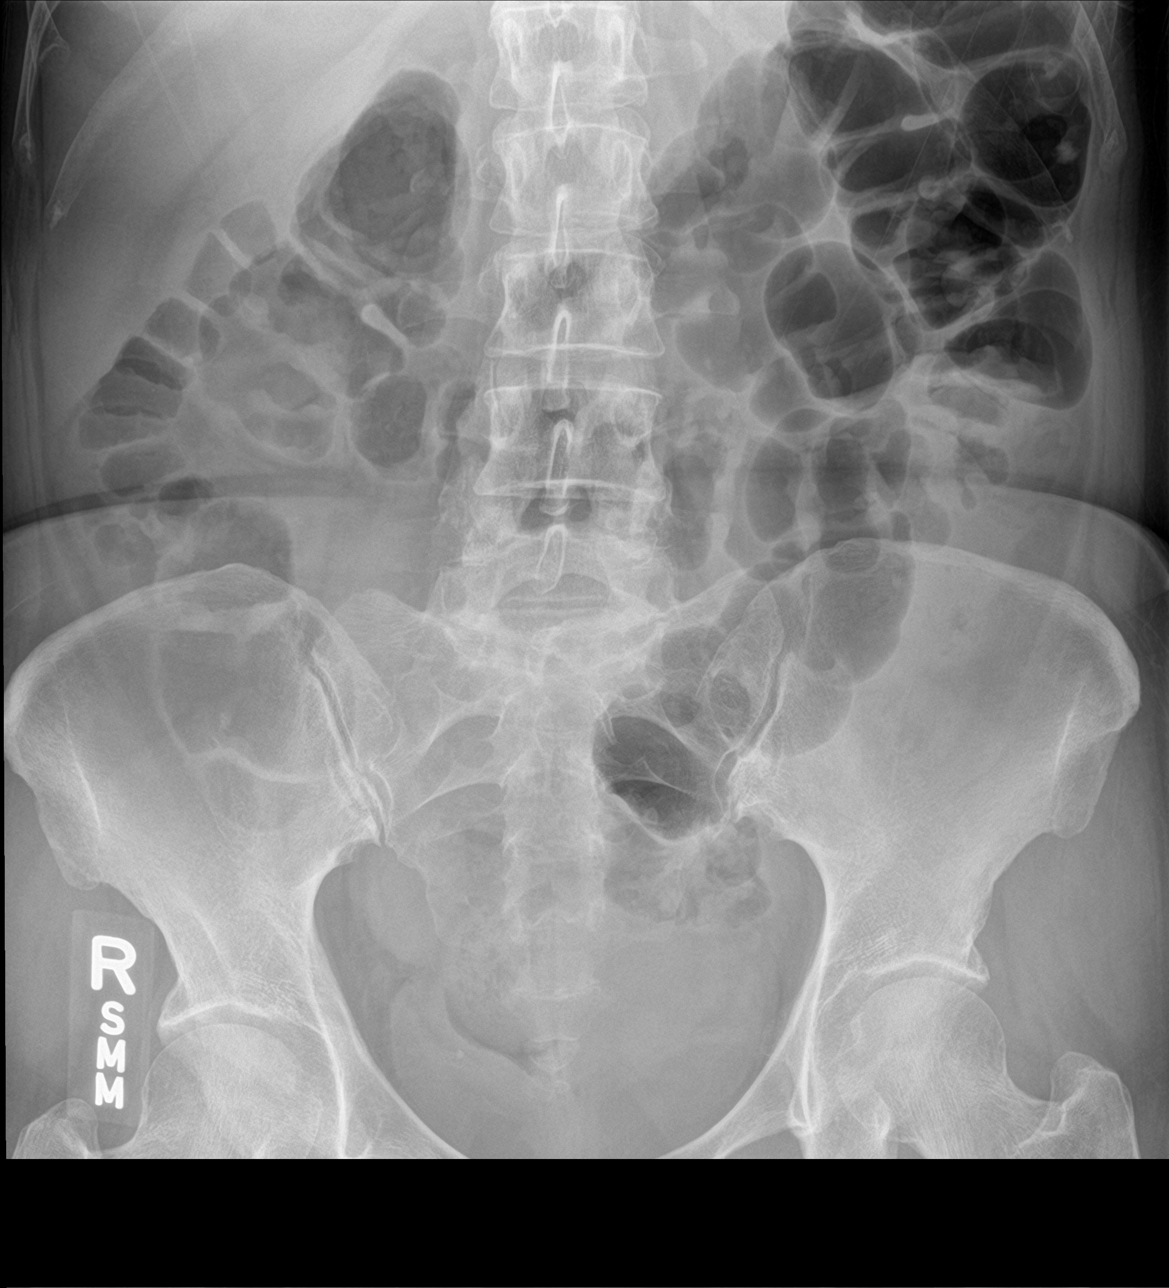

[3 of 3 positions shown; findings below may reference images not displayed]

FINDINGS: There is no bowel dilation to suggest obstruction. There are
scattered air-fluid levels on the erect view. No free air.

No significant increased stool noted in the colon.

No evidence of renal or ureteral stones.

The heart, mediastinum and hila are unremarkable. The lungs are
clear.

No acute skeletal abnormality.
IMPRESSION: 1. No evidence of bowel obstruction or free air.
2. Air-fluid levels on the erect view. This may be due to
gastroenteritis or a mild adynamic ileus.
3. No active cardiopulmonary disease.

## 2018-07-20 MED FILL — COMBIPATCH 0.05-0.25 MG PTC: 0.05-0.25 | 28 days supply | Qty: 8 | Fill #0

## 2018-09-04 MED FILL — COMBIPATCH 0.05-0.25 MG PTC: 0.05-0.25 | 28 days supply | Qty: 8 | Fill #1

## 2018-10-16 MED FILL — COMBIPATCH 0.05-0.25 MG PTC: 0.05-0.25 | 28 days supply | Qty: 8 | Fill #2

## 2018-12-06 MED FILL — COMBIPATCH 0.05-0.25 MG PTC: 0.05-0.25 | 28 days supply | Qty: 8 | Fill #3

## 2019-01-07 MED FILL — COMBIPATCH 0.05-0.25 MG PTC: 0.05-0.25 | 28 days supply | Qty: 8 | Fill #4

## 2019-02-08 MED FILL — COMBIPATCH 0.05-0.25 MG PTC: 0.05-0.25 | 28 days supply | Qty: 8 | Fill #5

## 2019-02-12 DIAGNOSIS — Z20828 Contact with and (suspected) exposure to other viral communicable diseases: Secondary | ICD-10-CM | POA: Diagnosis not present

## 2019-03-05 IMAGING — US US ABDOMEN LIMITED
1 series · 14 of 25 positions shown · non-contrast
Comparison: None.

CLINICAL DATA: Epigastric pain for 2 months

EXAM:
ULTRASOUND ABDOMEN LIMITED RIGHT UPPER QUADRANT

[Series 1: us abdomen limited · 0.19mm/px · 14 of 54 slices shown]
[im 1/54]
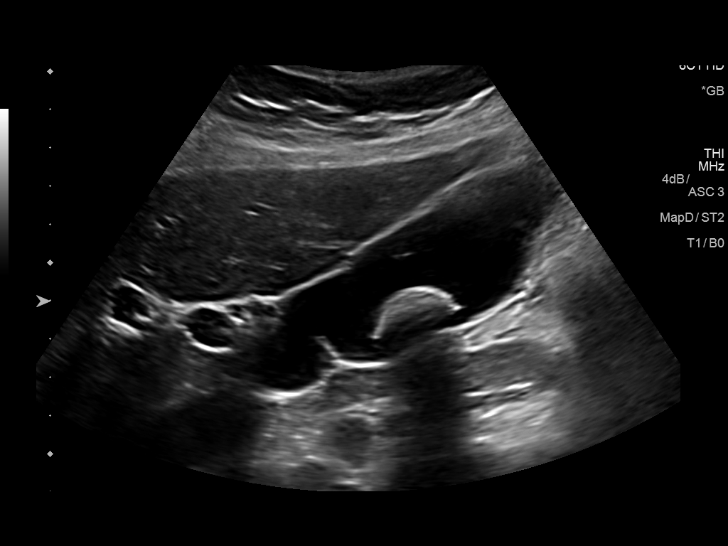
[im 5/54]
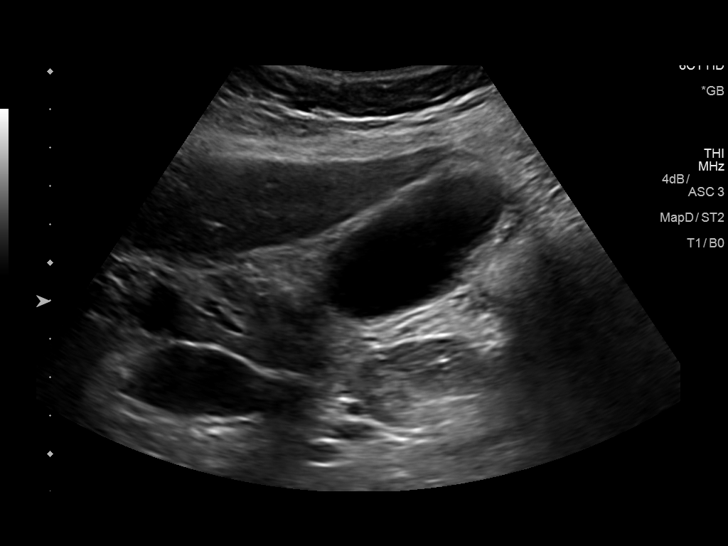
[im 9/54]
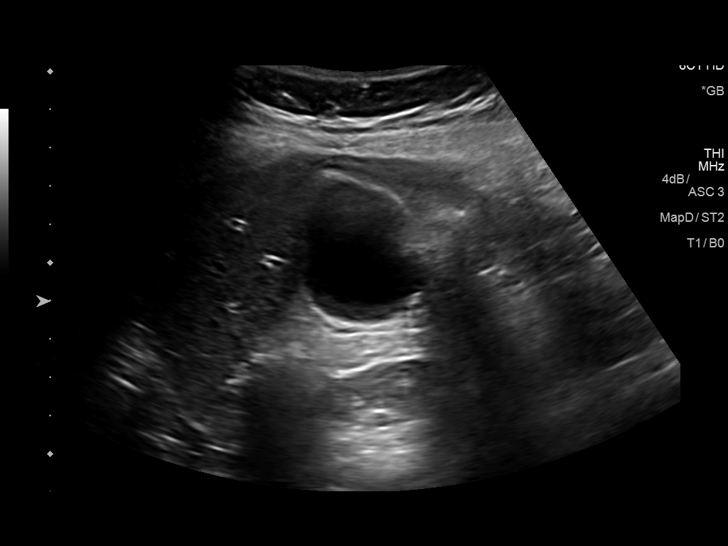
[im 14/54]
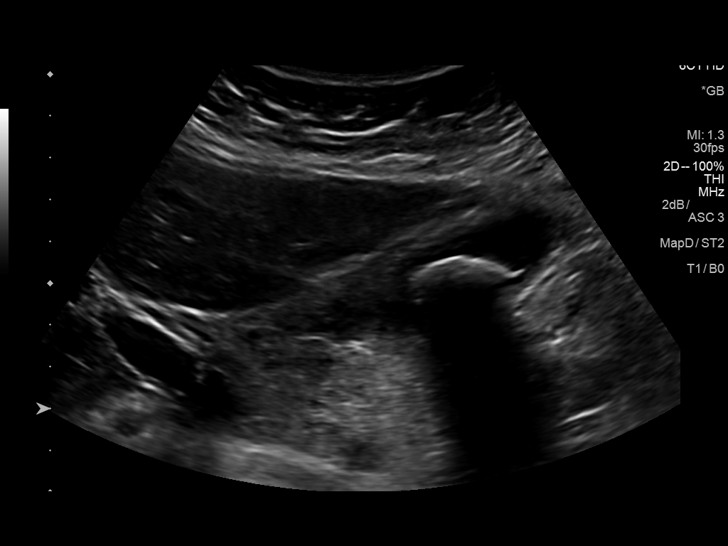
[im 18/54]
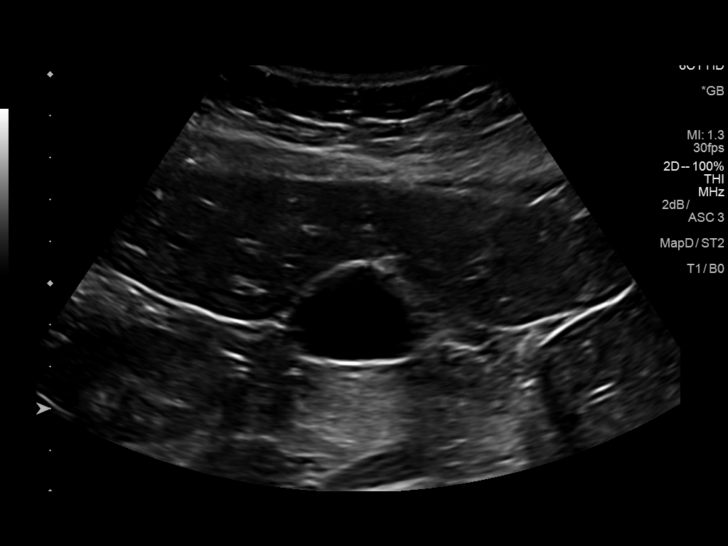
[im 20/54]
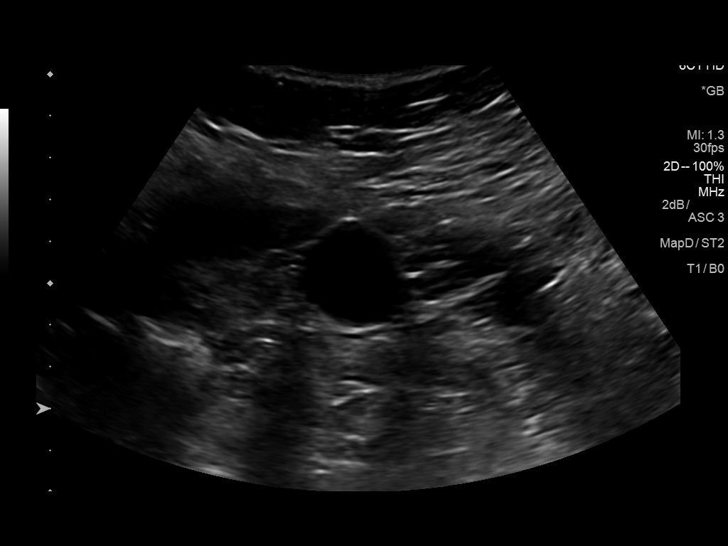
[im 25/54]
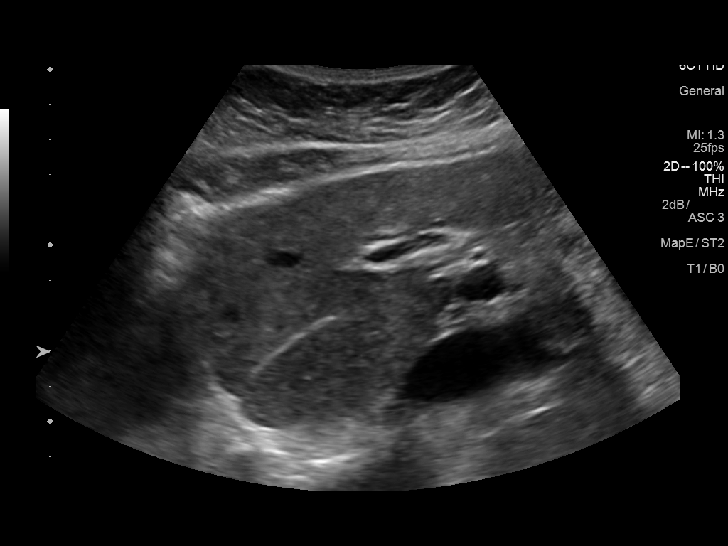
[im 29/54]
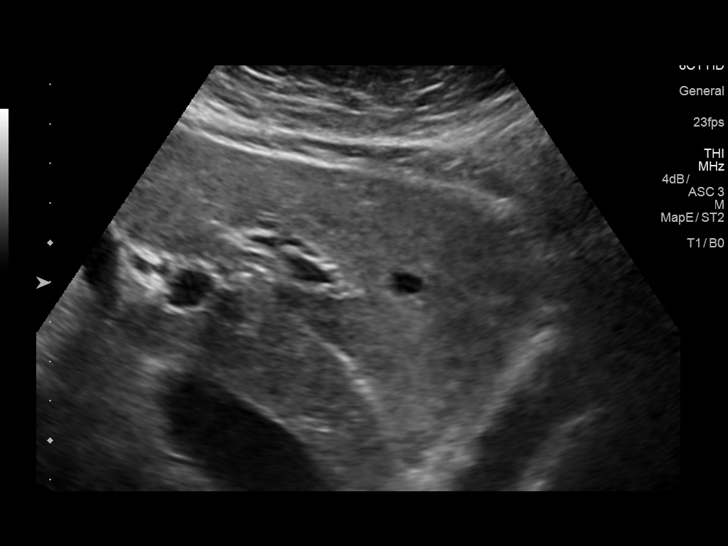
[im 34/54]
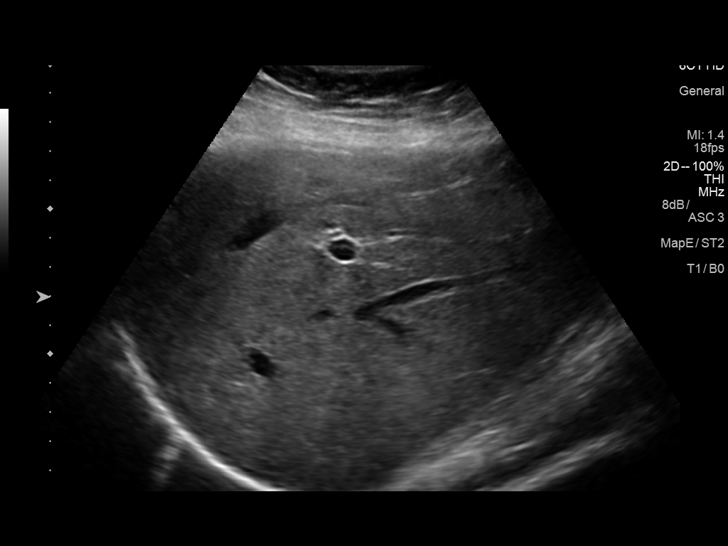
[im 36/54]
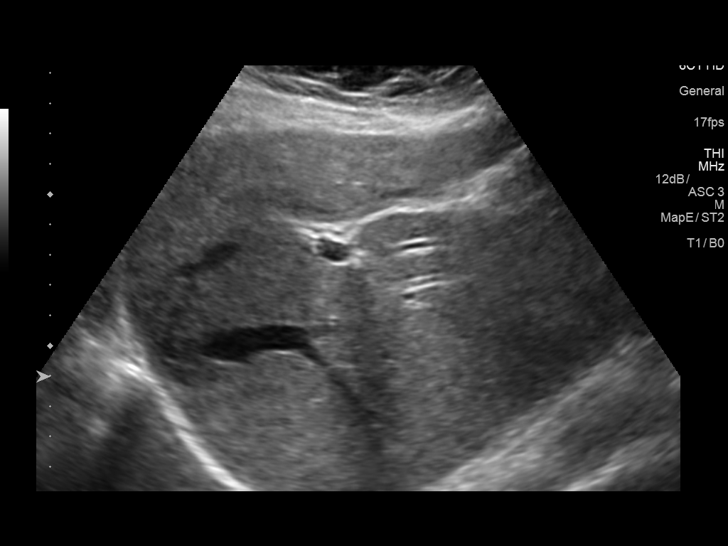
[im 40/54]
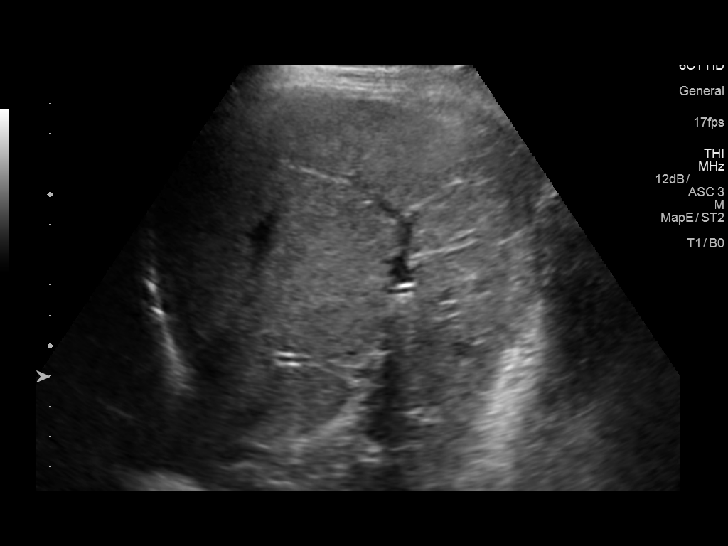
[im 45/54]
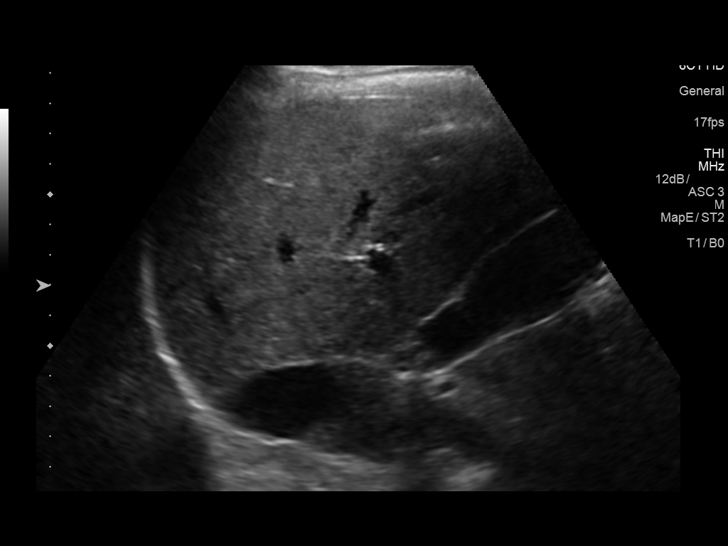
[im 49/54]
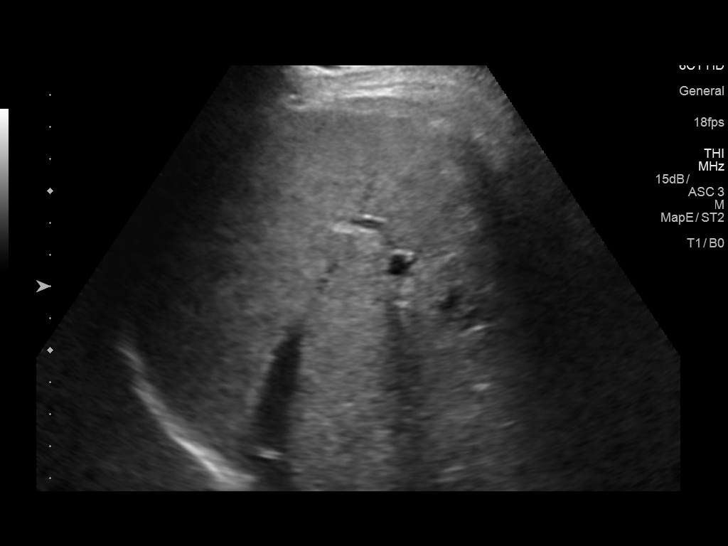
[im 54/54]
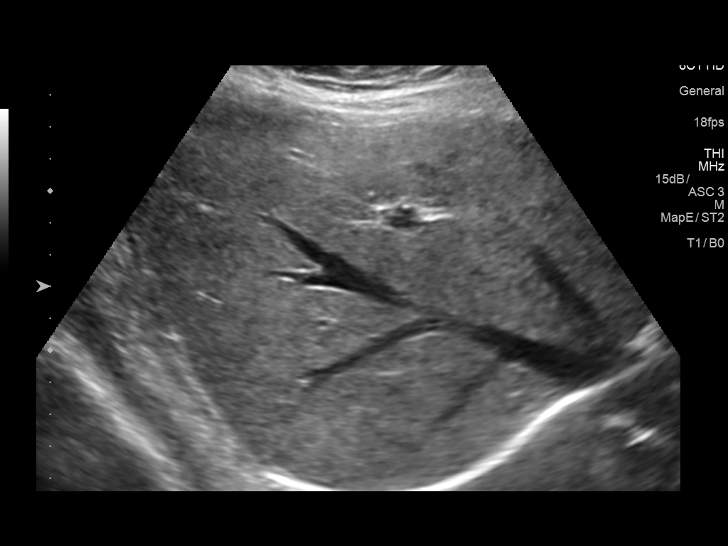

[14 of 25 positions shown; findings below may reference images not displayed]

FINDINGS: Gallbladder:

Large 2 cm gallstone is mobile within the lumen gallbladder. No
gallbladder wall thickening. The gallbladder is not distended. No
pericholecystic fluid. Negative sonographic Murphy's sign.

Common bile duct:

Diameter: Normal 4 mm

Liver:

No focal lesion identified. Within normal limits in parenchymal
echogenicity. Portal vein is patent on color Doppler imaging with
normal direction of blood flow towards the liver.
IMPRESSION: 1. Large gallstone without evidence cholecystitis.
2. No biliary duct dilatation.

## 2019-03-19 DIAGNOSIS — Z0000 Encounter for general adult medical examination without abnormal findings: Secondary | ICD-10-CM | POA: Diagnosis not present

## 2019-03-19 DIAGNOSIS — I10 Essential (primary) hypertension: Secondary | ICD-10-CM | POA: Diagnosis not present

## 2019-03-26 DIAGNOSIS — R067 Sneezing: Secondary | ICD-10-CM | POA: Diagnosis not present

## 2019-03-26 DIAGNOSIS — F419 Anxiety disorder, unspecified: Secondary | ICD-10-CM | POA: Diagnosis not present

## 2019-03-26 DIAGNOSIS — E785 Hyperlipidemia, unspecified: Secondary | ICD-10-CM | POA: Diagnosis not present

## 2019-03-26 DIAGNOSIS — Z0000 Encounter for general adult medical examination without abnormal findings: Secondary | ICD-10-CM | POA: Diagnosis not present

## 2019-04-02 MED FILL — COMBIPATCH 0.05-0.25 MG PTC: 0.05-0.25 | 28 days supply | Qty: 8 | Fill #0

## 2019-04-15 DIAGNOSIS — R05 Cough: Secondary | ICD-10-CM | POA: Diagnosis not present

## 2019-05-10 DIAGNOSIS — D229 Melanocytic nevi, unspecified: Secondary | ICD-10-CM | POA: Diagnosis not present

## 2019-05-10 DIAGNOSIS — C4359 Malignant melanoma of other part of trunk: Secondary | ICD-10-CM | POA: Diagnosis not present

## 2019-05-10 DIAGNOSIS — Z85820 Personal history of malignant melanoma of skin: Secondary | ICD-10-CM | POA: Diagnosis not present

## 2019-05-27 MED FILL — COMBIPATCH 0.05-0.25 MG PTC: 0.05-0.25 | 28 days supply | Qty: 8 | Fill #0

## 2019-06-20 DIAGNOSIS — C4359 Malignant melanoma of other part of trunk: Secondary | ICD-10-CM | POA: Diagnosis not present

## 2019-07-01 MED FILL — COMBIPATCH 0.05-0.25 MG PTC: 0.05-0.25 | 28 days supply | Qty: 8 | Fill #0

## 2019-07-11 DIAGNOSIS — E785 Hyperlipidemia, unspecified: Secondary | ICD-10-CM | POA: Diagnosis not present

## 2019-07-15 DIAGNOSIS — R69 Illness, unspecified: Secondary | ICD-10-CM | POA: Diagnosis not present

## 2019-07-15 DIAGNOSIS — E785 Hyperlipidemia, unspecified: Secondary | ICD-10-CM | POA: Diagnosis not present

## 2019-07-15 DIAGNOSIS — I10 Essential (primary) hypertension: Secondary | ICD-10-CM | POA: Diagnosis not present

## 2019-09-12 DIAGNOSIS — Z6826 Body mass index (BMI) 26.0-26.9, adult: Secondary | ICD-10-CM | POA: Diagnosis not present

## 2019-09-12 DIAGNOSIS — R310 Gross hematuria: Secondary | ICD-10-CM | POA: Diagnosis not present

## 2019-09-12 DIAGNOSIS — N390 Urinary tract infection, site not specified: Secondary | ICD-10-CM | POA: Diagnosis not present

## 2019-09-12 DIAGNOSIS — Z01419 Encounter for gynecological examination (general) (routine) without abnormal findings: Secondary | ICD-10-CM | POA: Diagnosis not present

## 2019-09-12 DIAGNOSIS — R350 Frequency of micturition: Secondary | ICD-10-CM | POA: Diagnosis not present

## 2019-09-24 DIAGNOSIS — Z1231 Encounter for screening mammogram for malignant neoplasm of breast: Secondary | ICD-10-CM | POA: Diagnosis not present

## 2019-10-14 DIAGNOSIS — M62830 Muscle spasm of back: Secondary | ICD-10-CM | POA: Diagnosis not present

## 2019-11-22 DIAGNOSIS — I10 Essential (primary) hypertension: Secondary | ICD-10-CM | POA: Diagnosis not present

## 2019-11-22 DIAGNOSIS — R69 Illness, unspecified: Secondary | ICD-10-CM | POA: Diagnosis not present

## 2020-04-02 DIAGNOSIS — E785 Hyperlipidemia, unspecified: Secondary | ICD-10-CM | POA: Diagnosis not present

## 2020-04-02 DIAGNOSIS — R946 Abnormal results of thyroid function studies: Secondary | ICD-10-CM | POA: Diagnosis not present

## 2020-04-02 DIAGNOSIS — I10 Essential (primary) hypertension: Secondary | ICD-10-CM | POA: Diagnosis not present

## 2020-04-02 DIAGNOSIS — R7309 Other abnormal glucose: Secondary | ICD-10-CM | POA: Diagnosis not present

## 2020-04-09 DIAGNOSIS — R69 Illness, unspecified: Secondary | ICD-10-CM | POA: Diagnosis not present

## 2020-04-09 DIAGNOSIS — Z0000 Encounter for general adult medical examination without abnormal findings: Secondary | ICD-10-CM | POA: Diagnosis not present

## 2020-04-09 DIAGNOSIS — Z566 Other physical and mental strain related to work: Secondary | ICD-10-CM | POA: Diagnosis not present

## 2020-04-09 DIAGNOSIS — Z1211 Encounter for screening for malignant neoplasm of colon: Secondary | ICD-10-CM | POA: Diagnosis not present

## 2020-04-09 DIAGNOSIS — E785 Hyperlipidemia, unspecified: Secondary | ICD-10-CM | POA: Diagnosis not present

## 2020-04-09 DIAGNOSIS — K219 Gastro-esophageal reflux disease without esophagitis: Secondary | ICD-10-CM | POA: Diagnosis not present

## 2020-05-12 DIAGNOSIS — Z20822 Contact with and (suspected) exposure to covid-19: Secondary | ICD-10-CM | POA: Diagnosis not present

## 2020-06-01 DIAGNOSIS — K639 Disease of intestine, unspecified: Secondary | ICD-10-CM | POA: Diagnosis not present

## 2020-06-01 DIAGNOSIS — Z1211 Encounter for screening for malignant neoplasm of colon: Secondary | ICD-10-CM | POA: Diagnosis not present

## 2020-06-01 DIAGNOSIS — K219 Gastro-esophageal reflux disease without esophagitis: Secondary | ICD-10-CM | POA: Diagnosis not present

## 2021-10-05 DIAGNOSIS — Z1231 Encounter for screening mammogram for malignant neoplasm of breast: Secondary | ICD-10-CM | POA: Diagnosis not present

## 2021-10-05 DIAGNOSIS — Z6827 Body mass index (BMI) 27.0-27.9, adult: Secondary | ICD-10-CM | POA: Diagnosis not present

## 2021-10-05 DIAGNOSIS — Z01419 Encounter for gynecological examination (general) (routine) without abnormal findings: Secondary | ICD-10-CM | POA: Diagnosis not present

## 2021-11-25 DIAGNOSIS — E782 Mixed hyperlipidemia: Secondary | ICD-10-CM | POA: Diagnosis not present

## 2021-12-01 DIAGNOSIS — Z79890 Hormone replacement therapy (postmenopausal): Secondary | ICD-10-CM | POA: Diagnosis not present

## 2021-12-01 DIAGNOSIS — Z7185 Encounter for immunization safety counseling: Secondary | ICD-10-CM | POA: Diagnosis not present

## 2021-12-01 DIAGNOSIS — E782 Mixed hyperlipidemia: Secondary | ICD-10-CM | POA: Diagnosis not present

## 2021-12-01 DIAGNOSIS — I10 Essential (primary) hypertension: Secondary | ICD-10-CM | POA: Diagnosis not present

## 2021-12-15 DIAGNOSIS — Z0000 Encounter for general adult medical examination without abnormal findings: Secondary | ICD-10-CM | POA: Diagnosis not present

## 2021-12-15 DIAGNOSIS — I10 Essential (primary) hypertension: Secondary | ICD-10-CM | POA: Diagnosis not present

## 2021-12-15 DIAGNOSIS — E782 Mixed hyperlipidemia: Secondary | ICD-10-CM | POA: Diagnosis not present

## 2021-12-15 DIAGNOSIS — G4700 Insomnia, unspecified: Secondary | ICD-10-CM | POA: Diagnosis not present

## 2022-02-03 DIAGNOSIS — F411 Generalized anxiety disorder: Secondary | ICD-10-CM | POA: Diagnosis not present

## 2022-02-03 DIAGNOSIS — G4700 Insomnia, unspecified: Secondary | ICD-10-CM | POA: Diagnosis not present

## 2022-02-03 DIAGNOSIS — F331 Major depressive disorder, recurrent, moderate: Secondary | ICD-10-CM | POA: Diagnosis not present

## 2022-02-03 DIAGNOSIS — I10 Essential (primary) hypertension: Secondary | ICD-10-CM | POA: Diagnosis not present

## 2022-03-08 DIAGNOSIS — Z08 Encounter for follow-up examination after completed treatment for malignant neoplasm: Secondary | ICD-10-CM | POA: Diagnosis not present

## 2022-03-08 DIAGNOSIS — Z1283 Encounter for screening for malignant neoplasm of skin: Secondary | ICD-10-CM | POA: Diagnosis not present

## 2022-03-08 DIAGNOSIS — Z86006 Personal history of melanoma in-situ: Secondary | ICD-10-CM | POA: Diagnosis not present

## 2022-03-08 DIAGNOSIS — L821 Other seborrheic keratosis: Secondary | ICD-10-CM | POA: Diagnosis not present

## 2022-03-22 DIAGNOSIS — D3131 Benign neoplasm of right choroid: Secondary | ICD-10-CM | POA: Diagnosis not present

## 2022-03-22 DIAGNOSIS — H524 Presbyopia: Secondary | ICD-10-CM | POA: Diagnosis not present

## 2022-03-22 DIAGNOSIS — H2513 Age-related nuclear cataract, bilateral: Secondary | ICD-10-CM | POA: Diagnosis not present

## 2022-06-07 DIAGNOSIS — L820 Inflamed seborrheic keratosis: Secondary | ICD-10-CM | POA: Diagnosis not present

## 2022-06-14 DIAGNOSIS — Z13820 Encounter for screening for osteoporosis: Secondary | ICD-10-CM | POA: Diagnosis not present

## 2022-06-14 DIAGNOSIS — N958 Other specified menopausal and perimenopausal disorders: Secondary | ICD-10-CM | POA: Diagnosis not present

## 2022-08-03 DIAGNOSIS — I10 Essential (primary) hypertension: Secondary | ICD-10-CM | POA: Diagnosis not present

## 2022-08-03 DIAGNOSIS — E559 Vitamin D deficiency, unspecified: Secondary | ICD-10-CM | POA: Diagnosis not present

## 2022-08-03 DIAGNOSIS — E782 Mixed hyperlipidemia: Secondary | ICD-10-CM | POA: Diagnosis not present

## 2022-08-09 DIAGNOSIS — I10 Essential (primary) hypertension: Secondary | ICD-10-CM | POA: Diagnosis not present

## 2022-08-09 DIAGNOSIS — E782 Mixed hyperlipidemia: Secondary | ICD-10-CM | POA: Diagnosis not present

## 2022-08-09 DIAGNOSIS — E559 Vitamin D deficiency, unspecified: Secondary | ICD-10-CM | POA: Diagnosis not present

## 2022-08-09 DIAGNOSIS — K219 Gastro-esophageal reflux disease without esophagitis: Secondary | ICD-10-CM | POA: Diagnosis not present

## 2022-08-24 DIAGNOSIS — J069 Acute upper respiratory infection, unspecified: Secondary | ICD-10-CM | POA: Diagnosis not present

## 2022-08-24 DIAGNOSIS — G4700 Insomnia, unspecified: Secondary | ICD-10-CM | POA: Diagnosis not present

## 2022-08-24 DIAGNOSIS — I10 Essential (primary) hypertension: Secondary | ICD-10-CM | POA: Diagnosis not present

## 2022-08-24 DIAGNOSIS — F411 Generalized anxiety disorder: Secondary | ICD-10-CM | POA: Diagnosis not present

## 2022-09-14 DIAGNOSIS — E669 Obesity, unspecified: Secondary | ICD-10-CM | POA: Diagnosis not present

## 2022-09-14 DIAGNOSIS — I10 Essential (primary) hypertension: Secondary | ICD-10-CM | POA: Diagnosis not present

## 2022-09-20 DIAGNOSIS — C44622 Squamous cell carcinoma of skin of right upper limb, including shoulder: Secondary | ICD-10-CM | POA: Diagnosis not present

## 2022-10-24 DIAGNOSIS — Z1283 Encounter for screening for malignant neoplasm of skin: Secondary | ICD-10-CM | POA: Diagnosis not present

## 2022-10-24 DIAGNOSIS — Z85828 Personal history of other malignant neoplasm of skin: Secondary | ICD-10-CM | POA: Diagnosis not present

## 2022-10-24 DIAGNOSIS — Z85820 Personal history of malignant melanoma of skin: Secondary | ICD-10-CM | POA: Diagnosis not present

## 2022-10-24 DIAGNOSIS — D225 Melanocytic nevi of trunk: Secondary | ICD-10-CM | POA: Diagnosis not present

## 2022-10-25 DIAGNOSIS — E559 Vitamin D deficiency, unspecified: Secondary | ICD-10-CM | POA: Diagnosis not present

## 2022-10-25 DIAGNOSIS — I10 Essential (primary) hypertension: Secondary | ICD-10-CM | POA: Diagnosis not present

## 2022-10-25 DIAGNOSIS — E782 Mixed hyperlipidemia: Secondary | ICD-10-CM | POA: Diagnosis not present

## 2022-10-25 DIAGNOSIS — Z1231 Encounter for screening mammogram for malignant neoplasm of breast: Secondary | ICD-10-CM | POA: Diagnosis not present

## 2022-10-25 DIAGNOSIS — Z01419 Encounter for gynecological examination (general) (routine) without abnormal findings: Secondary | ICD-10-CM | POA: Diagnosis not present

## 2022-10-25 DIAGNOSIS — Z6827 Body mass index (BMI) 27.0-27.9, adult: Secondary | ICD-10-CM | POA: Diagnosis not present

## 2022-11-01 DIAGNOSIS — E559 Vitamin D deficiency, unspecified: Secondary | ICD-10-CM | POA: Diagnosis not present

## 2022-11-01 DIAGNOSIS — E782 Mixed hyperlipidemia: Secondary | ICD-10-CM | POA: Diagnosis not present

## 2022-11-01 DIAGNOSIS — R7401 Elevation of levels of liver transaminase levels: Secondary | ICD-10-CM | POA: Diagnosis not present

## 2022-11-01 DIAGNOSIS — I10 Essential (primary) hypertension: Secondary | ICD-10-CM | POA: Diagnosis not present

## 2022-11-22 DIAGNOSIS — Z0000 Encounter for general adult medical examination without abnormal findings: Secondary | ICD-10-CM | POA: Diagnosis not present

## 2022-11-22 DIAGNOSIS — Z1339 Encounter for screening examination for other mental health and behavioral disorders: Secondary | ICD-10-CM | POA: Diagnosis not present

## 2022-11-22 DIAGNOSIS — Z1331 Encounter for screening for depression: Secondary | ICD-10-CM | POA: Diagnosis not present

## 2022-11-28 DIAGNOSIS — K080 Exfoliation of teeth due to systemic causes: Secondary | ICD-10-CM | POA: Diagnosis not present

## 2022-12-06 DIAGNOSIS — G4700 Insomnia, unspecified: Secondary | ICD-10-CM | POA: Diagnosis not present

## 2022-12-06 DIAGNOSIS — R030 Elevated blood-pressure reading, without diagnosis of hypertension: Secondary | ICD-10-CM | POA: Diagnosis not present

## 2022-12-06 DIAGNOSIS — I10 Essential (primary) hypertension: Secondary | ICD-10-CM | POA: Diagnosis not present

## 2023-01-05 DIAGNOSIS — I10 Essential (primary) hypertension: Secondary | ICD-10-CM | POA: Diagnosis not present

## 2023-01-05 DIAGNOSIS — E663 Overweight: Secondary | ICD-10-CM | POA: Diagnosis not present

## 2023-01-05 DIAGNOSIS — R030 Elevated blood-pressure reading, without diagnosis of hypertension: Secondary | ICD-10-CM | POA: Diagnosis not present

## 2023-01-05 DIAGNOSIS — E782 Mixed hyperlipidemia: Secondary | ICD-10-CM | POA: Diagnosis not present

## 2023-02-02 DIAGNOSIS — E663 Overweight: Secondary | ICD-10-CM | POA: Diagnosis not present

## 2023-02-02 DIAGNOSIS — R030 Elevated blood-pressure reading, without diagnosis of hypertension: Secondary | ICD-10-CM | POA: Diagnosis not present

## 2023-03-16 DIAGNOSIS — R030 Elevated blood-pressure reading, without diagnosis of hypertension: Secondary | ICD-10-CM | POA: Diagnosis not present

## 2023-03-16 DIAGNOSIS — I10 Essential (primary) hypertension: Secondary | ICD-10-CM | POA: Diagnosis not present

## 2023-03-21 ENCOUNTER — Other Ambulatory Visit: Payer: Self-pay | Admitting: Medical Genetics

## 2023-03-21 DIAGNOSIS — Z006 Encounter for examination for normal comparison and control in clinical research program: Secondary | ICD-10-CM

## 2023-03-23 DIAGNOSIS — H2513 Age-related nuclear cataract, bilateral: Secondary | ICD-10-CM | POA: Diagnosis not present

## 2023-03-23 DIAGNOSIS — H02831 Dermatochalasis of right upper eyelid: Secondary | ICD-10-CM | POA: Diagnosis not present

## 2023-03-23 DIAGNOSIS — D3132 Benign neoplasm of left choroid: Secondary | ICD-10-CM | POA: Diagnosis not present

## 2023-03-23 DIAGNOSIS — H02834 Dermatochalasis of left upper eyelid: Secondary | ICD-10-CM | POA: Diagnosis not present

## 2023-04-13 DIAGNOSIS — Z79890 Hormone replacement therapy (postmenopausal): Secondary | ICD-10-CM | POA: Diagnosis not present

## 2023-04-13 DIAGNOSIS — I10 Essential (primary) hypertension: Secondary | ICD-10-CM | POA: Diagnosis not present

## 2023-04-13 DIAGNOSIS — E663 Overweight: Secondary | ICD-10-CM | POA: Diagnosis not present

## 2023-04-13 DIAGNOSIS — G4700 Insomnia, unspecified: Secondary | ICD-10-CM | POA: Diagnosis not present

## 2023-04-24 DIAGNOSIS — Z1283 Encounter for screening for malignant neoplasm of skin: Secondary | ICD-10-CM | POA: Diagnosis not present

## 2023-04-24 DIAGNOSIS — Z08 Encounter for follow-up examination after completed treatment for malignant neoplasm: Secondary | ICD-10-CM | POA: Diagnosis not present

## 2023-04-24 DIAGNOSIS — Z85820 Personal history of malignant melanoma of skin: Secondary | ICD-10-CM | POA: Diagnosis not present

## 2023-05-09 DIAGNOSIS — R739 Hyperglycemia, unspecified: Secondary | ICD-10-CM | POA: Diagnosis not present

## 2023-05-09 DIAGNOSIS — I10 Essential (primary) hypertension: Secondary | ICD-10-CM | POA: Diagnosis not present

## 2023-05-09 DIAGNOSIS — E785 Hyperlipidemia, unspecified: Secondary | ICD-10-CM | POA: Diagnosis not present

## 2023-05-09 DIAGNOSIS — E559 Vitamin D deficiency, unspecified: Secondary | ICD-10-CM | POA: Diagnosis not present

## 2023-05-16 DIAGNOSIS — E559 Vitamin D deficiency, unspecified: Secondary | ICD-10-CM | POA: Diagnosis not present

## 2023-05-16 DIAGNOSIS — G720 Drug-induced myopathy: Secondary | ICD-10-CM | POA: Diagnosis not present

## 2023-05-16 DIAGNOSIS — R7303 Prediabetes: Secondary | ICD-10-CM | POA: Diagnosis not present

## 2023-05-16 DIAGNOSIS — J309 Allergic rhinitis, unspecified: Secondary | ICD-10-CM | POA: Diagnosis not present

## 2023-05-16 DIAGNOSIS — E782 Mixed hyperlipidemia: Secondary | ICD-10-CM | POA: Diagnosis not present

## 2023-05-16 DIAGNOSIS — G4700 Insomnia, unspecified: Secondary | ICD-10-CM | POA: Diagnosis not present

## 2023-05-31 DIAGNOSIS — H02423 Myogenic ptosis of bilateral eyelids: Secondary | ICD-10-CM | POA: Diagnosis not present

## 2023-05-31 DIAGNOSIS — H0279 Other degenerative disorders of eyelid and periocular area: Secondary | ICD-10-CM | POA: Diagnosis not present

## 2023-05-31 DIAGNOSIS — H02413 Mechanical ptosis of bilateral eyelids: Secondary | ICD-10-CM | POA: Diagnosis not present

## 2023-06-02 ENCOUNTER — Other Ambulatory Visit (HOSPITAL_COMMUNITY)
Admission: RE | Admit: 2023-06-02 | Discharge: 2023-06-02 | Disposition: A | Discharge status: Home / Self Care | Payer: Self-pay | Source: Ambulatory Visit | Attending: Oncology | Admitting: Oncology

## 2023-06-02 DIAGNOSIS — Z006 Encounter for examination for normal comparison and control in clinical research program: Secondary | ICD-10-CM | POA: Insufficient documentation

## 2023-06-12 DIAGNOSIS — K080 Exfoliation of teeth due to systemic causes: Secondary | ICD-10-CM | POA: Diagnosis not present

## 2023-06-18 LAB — GENECONNECT MOLECULAR SCREEN: Genetic Analysis Overall Interpretation: NEGATIVE

## 2023-06-30 DIAGNOSIS — I10 Essential (primary) hypertension: Secondary | ICD-10-CM | POA: Diagnosis not present

## 2023-07-27 DIAGNOSIS — F419 Anxiety disorder, unspecified: Secondary | ICD-10-CM | POA: Diagnosis not present

## 2023-07-31 DIAGNOSIS — R400 Somnolence: Secondary | ICD-10-CM | POA: Diagnosis not present

## 2023-07-31 DIAGNOSIS — R0683 Snoring: Secondary | ICD-10-CM | POA: Diagnosis not present

## 2023-07-31 DIAGNOSIS — I10 Essential (primary) hypertension: Secondary | ICD-10-CM | POA: Diagnosis not present

## 2023-08-03 DIAGNOSIS — F419 Anxiety disorder, unspecified: Secondary | ICD-10-CM | POA: Diagnosis not present

## 2023-08-10 DIAGNOSIS — F419 Anxiety disorder, unspecified: Secondary | ICD-10-CM | POA: Diagnosis not present

## 2023-08-11 DIAGNOSIS — Z1159 Encounter for screening for other viral diseases: Secondary | ICD-10-CM | POA: Diagnosis not present

## 2023-08-11 DIAGNOSIS — J069 Acute upper respiratory infection, unspecified: Secondary | ICD-10-CM | POA: Diagnosis not present

## 2023-08-16 DIAGNOSIS — Z1159 Encounter for screening for other viral diseases: Secondary | ICD-10-CM | POA: Diagnosis not present

## 2023-08-16 DIAGNOSIS — J40 Bronchitis, not specified as acute or chronic: Secondary | ICD-10-CM | POA: Diagnosis not present

## 2023-08-16 DIAGNOSIS — J329 Chronic sinusitis, unspecified: Secondary | ICD-10-CM | POA: Diagnosis not present

## 2023-08-16 DIAGNOSIS — J069 Acute upper respiratory infection, unspecified: Secondary | ICD-10-CM | POA: Diagnosis not present

## 2023-08-16 DIAGNOSIS — B9689 Other specified bacterial agents as the cause of diseases classified elsewhere: Secondary | ICD-10-CM | POA: Diagnosis not present

## 2023-08-21 DIAGNOSIS — J329 Chronic sinusitis, unspecified: Secondary | ICD-10-CM | POA: Diagnosis not present

## 2023-08-21 DIAGNOSIS — J069 Acute upper respiratory infection, unspecified: Secondary | ICD-10-CM | POA: Diagnosis not present

## 2023-08-21 DIAGNOSIS — I10 Essential (primary) hypertension: Secondary | ICD-10-CM | POA: Diagnosis not present

## 2023-08-21 DIAGNOSIS — B9689 Other specified bacterial agents as the cause of diseases classified elsewhere: Secondary | ICD-10-CM | POA: Diagnosis not present

## 2023-08-22 DIAGNOSIS — F419 Anxiety disorder, unspecified: Secondary | ICD-10-CM | POA: Diagnosis not present

## 2023-08-29 DIAGNOSIS — F419 Anxiety disorder, unspecified: Secondary | ICD-10-CM | POA: Diagnosis not present

## 2023-09-04 DIAGNOSIS — K2930 Chronic superficial gastritis without bleeding: Secondary | ICD-10-CM | POA: Diagnosis not present

## 2023-09-04 DIAGNOSIS — K3189 Other diseases of stomach and duodenum: Secondary | ICD-10-CM | POA: Diagnosis not present

## 2023-09-04 DIAGNOSIS — Z860101 Personal history of adenomatous and serrated colon polyps: Secondary | ICD-10-CM | POA: Diagnosis not present

## 2023-09-04 DIAGNOSIS — Z09 Encounter for follow-up examination after completed treatment for conditions other than malignant neoplasm: Secondary | ICD-10-CM | POA: Diagnosis not present

## 2023-09-04 DIAGNOSIS — K31A0 Gastric intestinal metaplasia, unspecified: Secondary | ICD-10-CM | POA: Diagnosis not present

## 2023-09-04 DIAGNOSIS — D123 Benign neoplasm of transverse colon: Secondary | ICD-10-CM | POA: Diagnosis not present

## 2023-09-04 DIAGNOSIS — K648 Other hemorrhoids: Secondary | ICD-10-CM | POA: Diagnosis not present

## 2023-09-04 DIAGNOSIS — K644 Residual hemorrhoidal skin tags: Secondary | ICD-10-CM | POA: Diagnosis not present

## 2023-09-05 DIAGNOSIS — F419 Anxiety disorder, unspecified: Secondary | ICD-10-CM | POA: Diagnosis not present

## 2023-09-06 DIAGNOSIS — K2930 Chronic superficial gastritis without bleeding: Secondary | ICD-10-CM | POA: Diagnosis not present

## 2023-09-06 DIAGNOSIS — D123 Benign neoplasm of transverse colon: Secondary | ICD-10-CM | POA: Diagnosis not present

## 2023-09-08 DIAGNOSIS — I10 Essential (primary) hypertension: Secondary | ICD-10-CM | POA: Diagnosis not present

## 2023-09-19 DIAGNOSIS — F419 Anxiety disorder, unspecified: Secondary | ICD-10-CM | POA: Diagnosis not present

## 2023-10-03 DIAGNOSIS — F419 Anxiety disorder, unspecified: Secondary | ICD-10-CM | POA: Diagnosis not present

## 2023-10-18 DIAGNOSIS — F411 Generalized anxiety disorder: Secondary | ICD-10-CM | POA: Diagnosis not present

## 2023-10-18 DIAGNOSIS — I10 Essential (primary) hypertension: Secondary | ICD-10-CM | POA: Diagnosis not present

## 2023-10-18 DIAGNOSIS — G4700 Insomnia, unspecified: Secondary | ICD-10-CM | POA: Diagnosis not present

## 2023-10-18 DIAGNOSIS — F331 Major depressive disorder, recurrent, moderate: Secondary | ICD-10-CM | POA: Diagnosis not present

## 2023-10-23 DIAGNOSIS — D225 Melanocytic nevi of trunk: Secondary | ICD-10-CM | POA: Diagnosis not present

## 2023-10-23 DIAGNOSIS — Z08 Encounter for follow-up examination after completed treatment for malignant neoplasm: Secondary | ICD-10-CM | POA: Diagnosis not present

## 2023-10-23 DIAGNOSIS — L820 Inflamed seborrheic keratosis: Secondary | ICD-10-CM | POA: Diagnosis not present

## 2023-10-23 DIAGNOSIS — L821 Other seborrheic keratosis: Secondary | ICD-10-CM | POA: Diagnosis not present

## 2023-10-23 DIAGNOSIS — Z85820 Personal history of malignant melanoma of skin: Secondary | ICD-10-CM | POA: Diagnosis not present

## 2023-10-25 DIAGNOSIS — K080 Exfoliation of teeth due to systemic causes: Secondary | ICD-10-CM | POA: Diagnosis not present

## 2023-10-31 DIAGNOSIS — F419 Anxiety disorder, unspecified: Secondary | ICD-10-CM | POA: Diagnosis not present

## 2023-11-13 DIAGNOSIS — Z124 Encounter for screening for malignant neoplasm of cervix: Secondary | ICD-10-CM | POA: Diagnosis not present

## 2023-11-13 DIAGNOSIS — Z1231 Encounter for screening mammogram for malignant neoplasm of breast: Secondary | ICD-10-CM | POA: Diagnosis not present

## 2023-11-13 DIAGNOSIS — N390 Urinary tract infection, site not specified: Secondary | ICD-10-CM | POA: Diagnosis not present

## 2023-11-13 DIAGNOSIS — Z01419 Encounter for gynecological examination (general) (routine) without abnormal findings: Secondary | ICD-10-CM | POA: Diagnosis not present

## 2023-11-13 DIAGNOSIS — Z6825 Body mass index (BMI) 25.0-25.9, adult: Secondary | ICD-10-CM | POA: Diagnosis not present

## 2023-11-13 DIAGNOSIS — R319 Hematuria, unspecified: Secondary | ICD-10-CM | POA: Diagnosis not present

## 2023-11-14 DIAGNOSIS — R5383 Other fatigue: Secondary | ICD-10-CM | POA: Diagnosis not present

## 2023-11-14 DIAGNOSIS — F419 Anxiety disorder, unspecified: Secondary | ICD-10-CM | POA: Diagnosis not present

## 2023-11-14 DIAGNOSIS — E559 Vitamin D deficiency, unspecified: Secondary | ICD-10-CM | POA: Diagnosis not present

## 2023-11-14 DIAGNOSIS — E785 Hyperlipidemia, unspecified: Secondary | ICD-10-CM | POA: Diagnosis not present

## 2023-11-16 DIAGNOSIS — I10 Essential (primary) hypertension: Secondary | ICD-10-CM | POA: Diagnosis not present

## 2023-11-16 DIAGNOSIS — E782 Mixed hyperlipidemia: Secondary | ICD-10-CM | POA: Diagnosis not present

## 2023-11-16 DIAGNOSIS — E559 Vitamin D deficiency, unspecified: Secondary | ICD-10-CM | POA: Diagnosis not present

## 2023-11-16 DIAGNOSIS — G4700 Insomnia, unspecified: Secondary | ICD-10-CM | POA: Diagnosis not present

## 2023-12-18 DIAGNOSIS — K080 Exfoliation of teeth due to systemic causes: Secondary | ICD-10-CM | POA: Diagnosis not present

## 2023-12-26 DIAGNOSIS — Z1331 Encounter for screening for depression: Secondary | ICD-10-CM | POA: Diagnosis not present

## 2023-12-26 DIAGNOSIS — Z1339 Encounter for screening examination for other mental health and behavioral disorders: Secondary | ICD-10-CM | POA: Diagnosis not present

## 2023-12-26 DIAGNOSIS — Z0000 Encounter for general adult medical examination without abnormal findings: Secondary | ICD-10-CM | POA: Diagnosis not present

## 2024-01-24 DIAGNOSIS — N200 Calculus of kidney: Secondary | ICD-10-CM | POA: Diagnosis not present

## 2024-01-29 DIAGNOSIS — Z0000 Encounter for general adult medical examination without abnormal findings: Secondary | ICD-10-CM | POA: Diagnosis not present

## 2024-01-29 DIAGNOSIS — Z13220 Encounter for screening for lipoid disorders: Secondary | ICD-10-CM | POA: Diagnosis not present

## 2024-01-31 DIAGNOSIS — R3121 Asymptomatic microscopic hematuria: Secondary | ICD-10-CM | POA: Diagnosis not present

## 2024-02-01 DIAGNOSIS — I10 Essential (primary) hypertension: Secondary | ICD-10-CM | POA: Diagnosis not present

## 2024-02-01 DIAGNOSIS — Z0000 Encounter for general adult medical examination without abnormal findings: Secondary | ICD-10-CM | POA: Diagnosis not present

## 2024-03-14 DIAGNOSIS — E785 Hyperlipidemia, unspecified: Secondary | ICD-10-CM | POA: Diagnosis not present

## 2024-03-14 DIAGNOSIS — I10 Essential (primary) hypertension: Secondary | ICD-10-CM | POA: Diagnosis not present

## 2024-03-18 DIAGNOSIS — E782 Mixed hyperlipidemia: Secondary | ICD-10-CM | POA: Diagnosis not present

## 2024-03-18 DIAGNOSIS — Z789 Other specified health status: Secondary | ICD-10-CM | POA: Diagnosis not present

## 2024-03-18 DIAGNOSIS — E559 Vitamin D deficiency, unspecified: Secondary | ICD-10-CM | POA: Diagnosis not present

## 2024-03-18 DIAGNOSIS — Z23 Encounter for immunization: Secondary | ICD-10-CM | POA: Diagnosis not present

## 2024-03-18 DIAGNOSIS — I10 Essential (primary) hypertension: Secondary | ICD-10-CM | POA: Diagnosis not present

## 2024-03-22 DIAGNOSIS — Z23 Encounter for immunization: Secondary | ICD-10-CM | POA: Diagnosis not present

## 2024-03-22 DIAGNOSIS — Z7185 Encounter for immunization safety counseling: Secondary | ICD-10-CM | POA: Diagnosis not present

## 2024-04-23 DIAGNOSIS — D1801 Hemangioma of skin and subcutaneous tissue: Secondary | ICD-10-CM | POA: Diagnosis not present

## 2024-04-23 DIAGNOSIS — Z1283 Encounter for screening for malignant neoplasm of skin: Secondary | ICD-10-CM | POA: Diagnosis not present

## 2024-04-23 DIAGNOSIS — Z08 Encounter for follow-up examination after completed treatment for malignant neoplasm: Secondary | ICD-10-CM | POA: Diagnosis not present

## 2024-04-23 DIAGNOSIS — D225 Melanocytic nevi of trunk: Secondary | ICD-10-CM | POA: Diagnosis not present

## 2024-04-23 DIAGNOSIS — Z85820 Personal history of malignant melanoma of skin: Secondary | ICD-10-CM | POA: Diagnosis not present

## 2024-05-08 DIAGNOSIS — H02831 Dermatochalasis of right upper eyelid: Secondary | ICD-10-CM | POA: Diagnosis not present

## 2024-05-08 DIAGNOSIS — H02834 Dermatochalasis of left upper eyelid: Secondary | ICD-10-CM | POA: Diagnosis not present

## 2024-05-08 DIAGNOSIS — D3192 Benign neoplasm of unspecified part of left eye: Secondary | ICD-10-CM | POA: Diagnosis not present

## 2024-05-08 DIAGNOSIS — H2513 Age-related nuclear cataract, bilateral: Secondary | ICD-10-CM | POA: Diagnosis not present

## 2024-05-20 DIAGNOSIS — R739 Hyperglycemia, unspecified: Secondary | ICD-10-CM | POA: Diagnosis not present

## 2024-05-20 DIAGNOSIS — R5383 Other fatigue: Secondary | ICD-10-CM | POA: Diagnosis not present

## 2024-05-20 DIAGNOSIS — E559 Vitamin D deficiency, unspecified: Secondary | ICD-10-CM | POA: Diagnosis not present

## 2024-05-20 DIAGNOSIS — E785 Hyperlipidemia, unspecified: Secondary | ICD-10-CM | POA: Diagnosis not present

## 2024-05-23 DIAGNOSIS — I10 Essential (primary) hypertension: Secondary | ICD-10-CM | POA: Diagnosis not present

## 2024-05-23 DIAGNOSIS — E559 Vitamin D deficiency, unspecified: Secondary | ICD-10-CM | POA: Diagnosis not present

## 2024-05-23 DIAGNOSIS — E782 Mixed hyperlipidemia: Secondary | ICD-10-CM | POA: Diagnosis not present

## 2024-05-23 DIAGNOSIS — R7303 Prediabetes: Secondary | ICD-10-CM | POA: Diagnosis not present

## 2024-06-14 DIAGNOSIS — H2513 Age-related nuclear cataract, bilateral: Secondary | ICD-10-CM | POA: Diagnosis not present

## 2024-06-14 DIAGNOSIS — H02831 Dermatochalasis of right upper eyelid: Secondary | ICD-10-CM | POA: Diagnosis not present

## 2024-06-14 DIAGNOSIS — H02834 Dermatochalasis of left upper eyelid: Secondary | ICD-10-CM | POA: Diagnosis not present

## 2024-06-14 DIAGNOSIS — D3192 Benign neoplasm of unspecified part of left eye: Secondary | ICD-10-CM | POA: Diagnosis not present

## 2024-06-14 DIAGNOSIS — H53452 Other localized visual field defect, left eye: Secondary | ICD-10-CM | POA: Diagnosis not present

## 2024-06-24 DIAGNOSIS — K080 Exfoliation of teeth due to systemic causes: Secondary | ICD-10-CM | POA: Diagnosis not present
